# Patient Record
Sex: Male | Born: 1935 | Race: White | Hispanic: No | Marital: Married | Smoking: Current every day smoker
Health system: Southern US, Community
[De-identification: ages and names within clinical notes are randomized; demographics above are authoritative.]

## PROBLEM LIST (undated history)

## (undated) DIAGNOSIS — E785 Hyperlipidemia, unspecified: Secondary | ICD-10-CM

## (undated) DIAGNOSIS — F418 Other specified anxiety disorders: Secondary | ICD-10-CM

## (undated) DIAGNOSIS — R296 Repeated falls: Secondary | ICD-10-CM

## (undated) DIAGNOSIS — K219 Gastro-esophageal reflux disease without esophagitis: Secondary | ICD-10-CM

## (undated) DIAGNOSIS — Z8546 Personal history of malignant neoplasm of prostate: Secondary | ICD-10-CM

## (undated) DIAGNOSIS — F0150 Vascular dementia without behavioral disturbance: Secondary | ICD-10-CM

## (undated) DIAGNOSIS — M6281 Muscle weakness (generalized): Secondary | ICD-10-CM

## (undated) DIAGNOSIS — I129 Hypertensive chronic kidney disease with stage 1 through stage 4 chronic kidney disease, or unspecified chronic kidney disease: Secondary | ICD-10-CM

## (undated) DIAGNOSIS — Z741 Need for assistance with personal care: Secondary | ICD-10-CM

## (undated) DIAGNOSIS — G2581 Restless legs syndrome: Secondary | ICD-10-CM

## (undated) DIAGNOSIS — L89892 Pressure ulcer of other site, stage 2: Secondary | ICD-10-CM

## (undated) DIAGNOSIS — G459 Transient cerebral ischemic attack, unspecified: Secondary | ICD-10-CM

## (undated) DIAGNOSIS — L03116 Cellulitis of left lower limb: Secondary | ICD-10-CM

## (undated) DIAGNOSIS — L89510 Pressure ulcer of right ankle, unstageable: Secondary | ICD-10-CM

## (undated) DIAGNOSIS — G4700 Insomnia, unspecified: Secondary | ICD-10-CM

## (undated) DIAGNOSIS — F32A Depression, unspecified: Secondary | ICD-10-CM

## (undated) DIAGNOSIS — H3530 Unspecified macular degeneration: Secondary | ICD-10-CM

## (undated) DIAGNOSIS — N401 Enlarged prostate with lower urinary tract symptoms: Secondary | ICD-10-CM

## (undated) DIAGNOSIS — R4189 Other symptoms and signs involving cognitive functions and awareness: Secondary | ICD-10-CM

## (undated) DIAGNOSIS — N3941 Urge incontinence: Secondary | ICD-10-CM

## (undated) DIAGNOSIS — R278 Other lack of coordination: Secondary | ICD-10-CM

## (undated) DIAGNOSIS — I10 Essential (primary) hypertension: Secondary | ICD-10-CM

## (undated) DIAGNOSIS — N1832 Chronic kidney disease, stage 3b: Secondary | ICD-10-CM

## (undated) DIAGNOSIS — S43006A Unspecified dislocation of unspecified shoulder joint, initial encounter: Secondary | ICD-10-CM

## (undated) DIAGNOSIS — I69898 Other sequelae of other cerebrovascular disease: Secondary | ICD-10-CM

## (undated) DIAGNOSIS — L89512 Pressure ulcer of right ankle, stage 2: Secondary | ICD-10-CM

## (undated) DIAGNOSIS — N3281 Overactive bladder: Secondary | ICD-10-CM

## (undated) HISTORY — DX: Unspecified macular degeneration: H35.30

## (undated) HISTORY — DX: Transient cerebral ischemic attack, unspecified: G45.9

## (undated) HISTORY — DX: Other lack of coordination: R27.8

## (undated) HISTORY — DX: Restless legs syndrome: G25.81

## (undated) HISTORY — DX: Other specified anxiety disorders: F41.8

## (undated) HISTORY — DX: Cellulitis of left lower limb: L03.116

## (undated) HISTORY — DX: Pressure ulcer of right ankle, unstageable: L89.510

## (undated) HISTORY — DX: Other sequelae of other cerebrovascular disease: I69.898

## (undated) HISTORY — DX: Depression, unspecified: F32.A

## (undated) HISTORY — DX: Hyperlipidemia, unspecified: E78.5

## (undated) HISTORY — PX: APPENDECTOMY: SHX54

## (undated) HISTORY — DX: Chronic kidney disease, stage 3b: N18.32

## (undated) HISTORY — DX: Gastro-esophageal reflux disease without esophagitis: K21.9

## (undated) HISTORY — DX: Overactive bladder: N32.81

## (undated) HISTORY — DX: Pressure ulcer of right ankle, stage 2: L89.512

## (undated) HISTORY — DX: Vascular dementia, unspecified severity, without behavioral disturbance, psychotic disturbance, mood disturbance, and anxiety: F01.50

## (undated) HISTORY — DX: Pressure ulcer of other site, stage 2: L89.892

## (undated) HISTORY — DX: Insomnia, unspecified: G47.00

## (undated) HISTORY — DX: Urge incontinence: N39.41

## (undated) HISTORY — DX: Muscle weakness (generalized): M62.81

## (undated) HISTORY — DX: Other symptoms and signs involving cognitive functions and awareness: R41.89

## (undated) HISTORY — DX: Benign prostatic hyperplasia with lower urinary tract symptoms: N40.1

## (undated) HISTORY — DX: Personal history of malignant neoplasm of prostate: Z85.46

## (undated) HISTORY — PX: HERNIA REPAIR: SHX51

## (undated) HISTORY — DX: Need for assistance with personal care: Z74.1

## (undated) HISTORY — DX: Hypertensive chronic kidney disease with stage 1 through stage 4 chronic kidney disease, or unspecified chronic kidney disease: I12.9

---

## 2007-01-02 ENCOUNTER — Ambulatory Visit: Payer: Self-pay | Admitting: Internal Medicine

## 2007-07-24 ENCOUNTER — Encounter: Payer: Self-pay | Admitting: Internal Medicine

## 2007-08-06 ENCOUNTER — Ambulatory Visit: Payer: Self-pay | Admitting: Gastroenterology

## 2007-08-22 ENCOUNTER — Encounter: Payer: Self-pay | Admitting: Internal Medicine

## 2007-09-21 ENCOUNTER — Encounter: Payer: Self-pay | Admitting: Internal Medicine

## 2007-10-22 ENCOUNTER — Encounter: Payer: Self-pay | Admitting: Internal Medicine

## 2008-05-29 ENCOUNTER — Ambulatory Visit: Payer: Self-pay | Admitting: Cardiology

## 2008-05-29 ENCOUNTER — Ambulatory Visit: Payer: Self-pay | Admitting: Ophthalmology

## 2008-06-10 ENCOUNTER — Ambulatory Visit: Payer: Self-pay | Admitting: Ophthalmology

## 2008-07-08 ENCOUNTER — Ambulatory Visit: Payer: Self-pay | Admitting: Ophthalmology

## 2008-07-15 ENCOUNTER — Ambulatory Visit: Payer: Self-pay | Admitting: Ophthalmology

## 2010-03-20 ENCOUNTER — Emergency Department: Payer: Self-pay | Admitting: Emergency Medicine

## 2010-03-30 ENCOUNTER — Ambulatory Visit: Payer: Self-pay | Admitting: Surgery

## 2010-04-05 ENCOUNTER — Ambulatory Visit: Payer: Self-pay | Admitting: Surgery

## 2010-04-07 LAB — PATHOLOGY REPORT

## 2010-04-15 ENCOUNTER — Emergency Department: Payer: Self-pay | Admitting: Emergency Medicine

## 2010-06-13 ENCOUNTER — Ambulatory Visit: Payer: Self-pay | Admitting: Family Medicine

## 2011-10-25 ENCOUNTER — Inpatient Hospital Stay: Payer: Self-pay | Admitting: Surgery

## 2011-10-25 LAB — URINALYSIS, COMPLETE
Bilirubin,UR: NEGATIVE
Glucose,UR: NEGATIVE mg/dL (ref 0–75)
Hyaline Cast: 1
Ketone: NEGATIVE
Leukocyte Esterase: NEGATIVE
Nitrite: NEGATIVE
Ph: 5 (ref 4.5–8.0)
RBC,UR: 4 /HPF (ref 0–5)
Specific Gravity: 1.042 (ref 1.003–1.030)
Squamous Epithelial: NONE SEEN
WBC UR: <1 /HPF (ref 0–5)

## 2011-10-25 LAB — CBC
MCH: 32.4 pg (ref 26.0–34.0)
Platelet: 187 10*3/uL (ref 150–440)
RBC: 4.27 10*6/uL — ABNORMAL LOW (ref 4.40–5.90)

## 2011-10-25 LAB — COMPREHENSIVE METABOLIC PANEL
Albumin: 3.5 g/dL (ref 3.4–5.0)
Alkaline Phosphatase: 52 U/L (ref 50–136)
Anion Gap: 9 (ref 7–16)
BUN: 24 mg/dL — ABNORMAL HIGH (ref 7–18)
Bilirubin,Total: 1 mg/dL (ref 0.2–1.0)
Calcium, Total: 8.7 mg/dL (ref 8.5–10.1)
Chloride: 97 mmol/L — ABNORMAL LOW (ref 98–107)
Co2: 27 mmol/L (ref 21–32)
Creatinine: 1.27 mg/dL (ref 0.60–1.30)
EGFR (African American): 58 — ABNORMAL LOW
EGFR (Non-African Amer.): 50 — ABNORMAL LOW
Potassium: 3.8 mmol/L (ref 3.5–5.1)
SGOT(AST): 18 U/L (ref 15–37)
SGPT (ALT): 23 U/L
Sodium: 133 mmol/L — ABNORMAL LOW (ref 136–145)
Total Protein: 6.3 g/dL — ABNORMAL LOW (ref 6.4–8.2)

## 2011-10-31 LAB — CULTURE, BLOOD (SINGLE)

## 2012-02-11 IMAGING — CT CT ABD-PELV W/ CM
1 of 2 series · 15 of 32 positions shown, 19 images · IV contrast (isovue)
Comparison: 01/02/2007

REASON FOR EXAM: (1) RLQ  pain; (2) same
COMMENTS:

PROCEDURE:     CT  - CT ABDOMEN / PELVIS  W  - March 20, 2010  [DATE]
RESULT:     History: Right lower quadrant pain.
TECHNIQUE: Multiple axial images of the abdomen and pelvis were performed
from the lung bases to the pubic symphysis, with p.o. contrast and with 100
ml of Isovue 370 intravenous contrast.

[Series 2: soft tissue · axial · 0.75mm/px · z∈[-368,+46]mm · 15 of 152 slices shown, 19 images]
[im 7/152  soft-tissue]
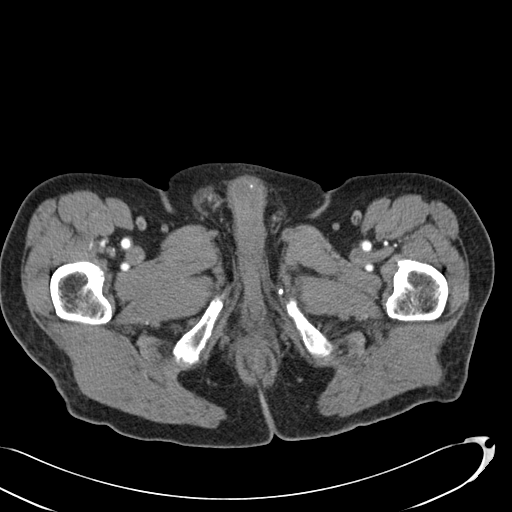
[im 7/152  bone]
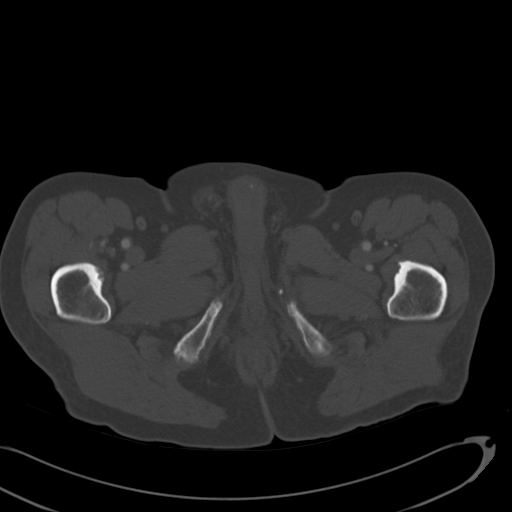
[im 20/152  soft-tissue]
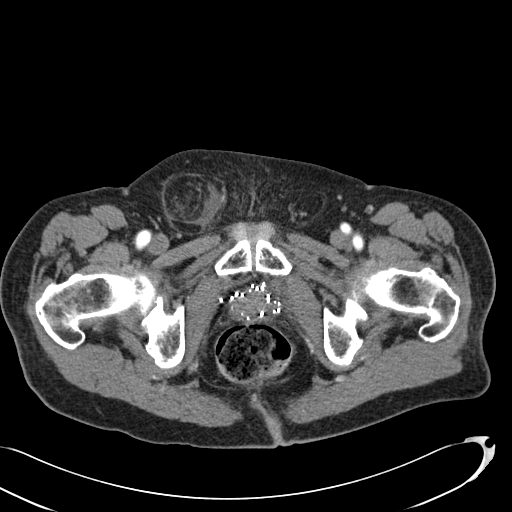
[im 33/152  soft-tissue]
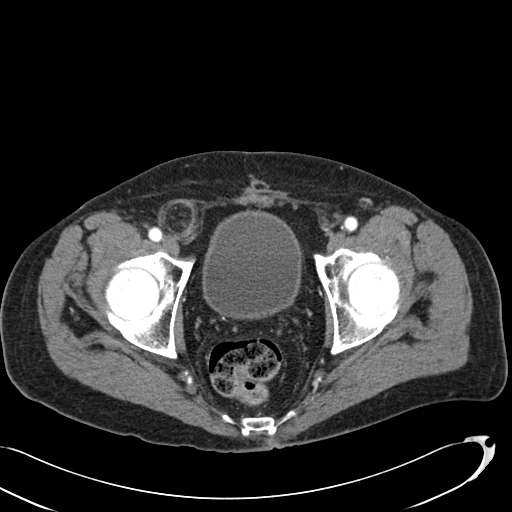
[im 40/152  soft-tissue]
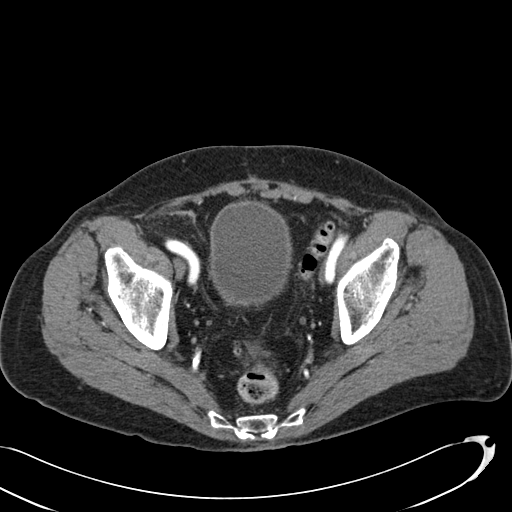
[im 53/152  soft-tissue]
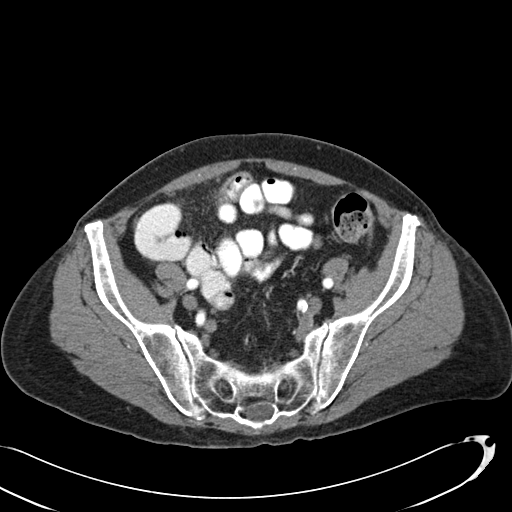
[im 66/152  soft-tissue]
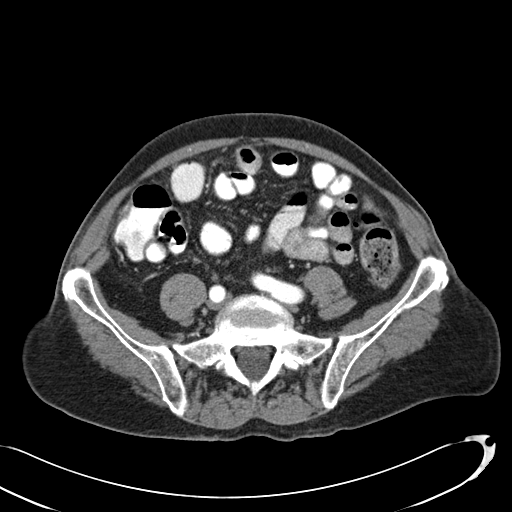
[im 79/152  soft-tissue]
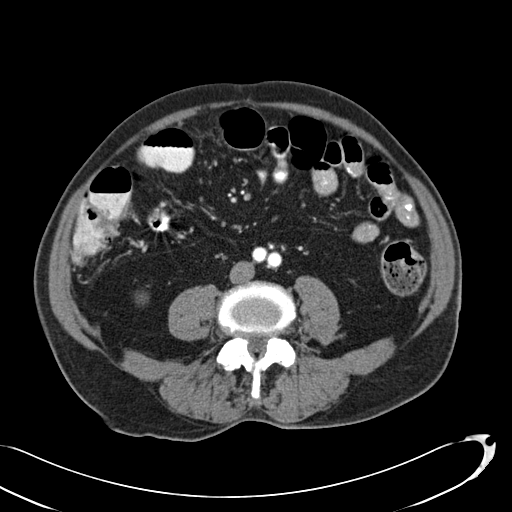
[im 86/152  soft-tissue]
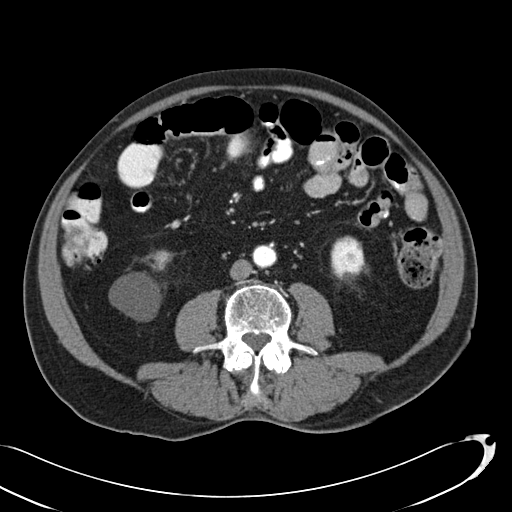
[im 99/152  soft-tissue]
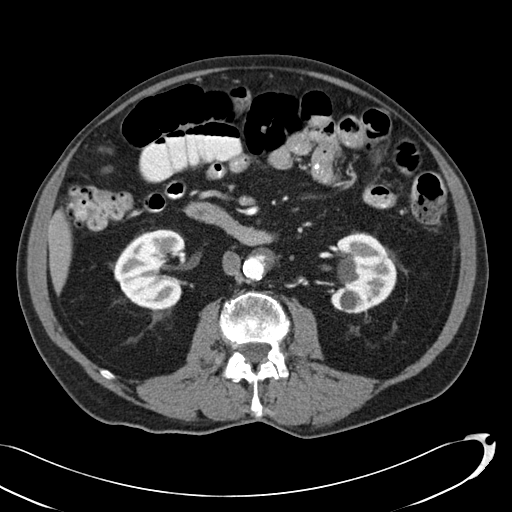
[im 99/152  bone]
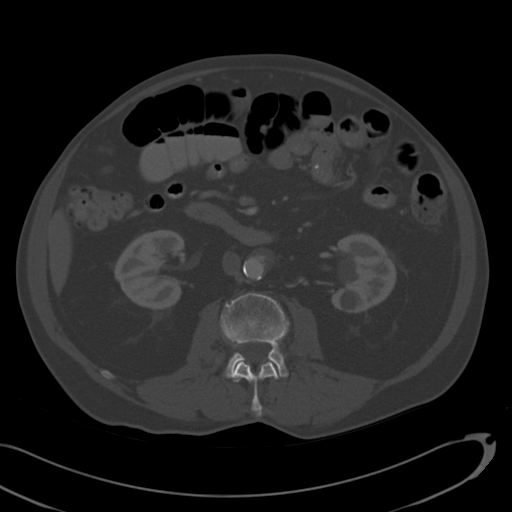
[im 112/152  soft-tissue]
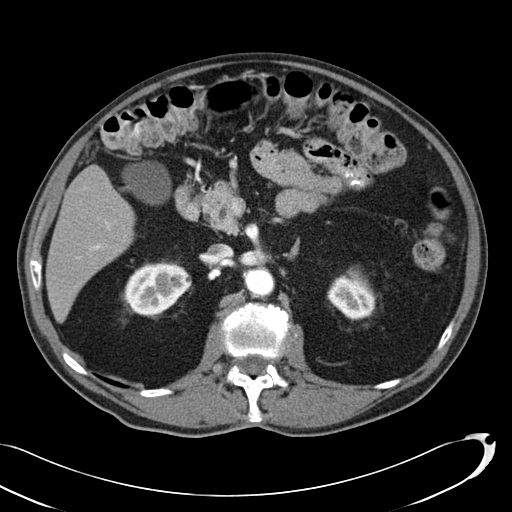
[im 119/152  soft-tissue]
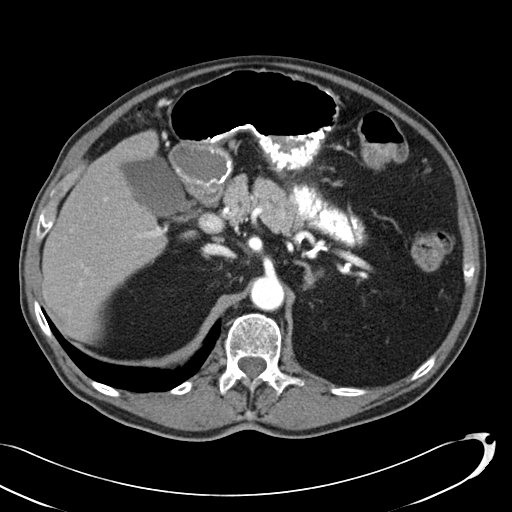
[im 125/152  lung]
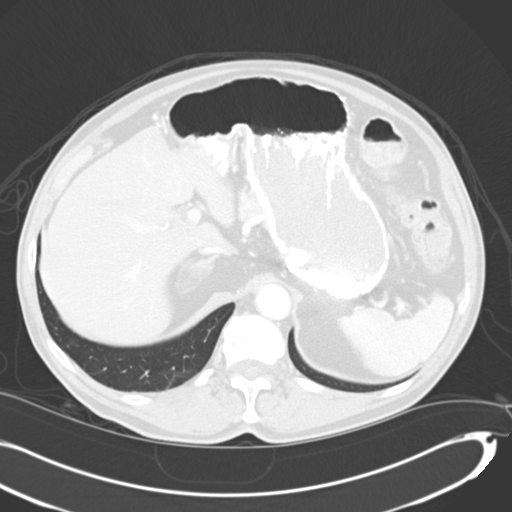
[im 132/152  soft-tissue]
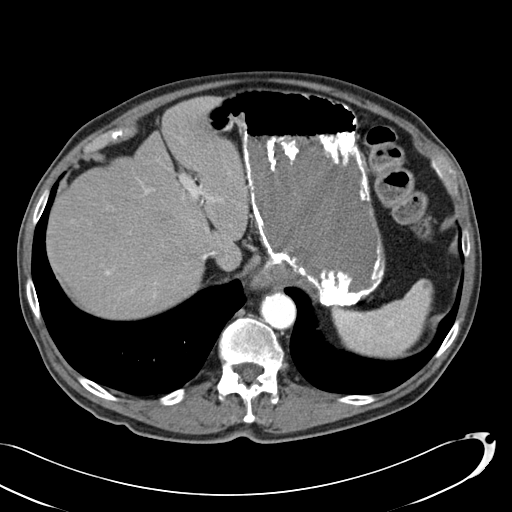
[im 132/152  lung]
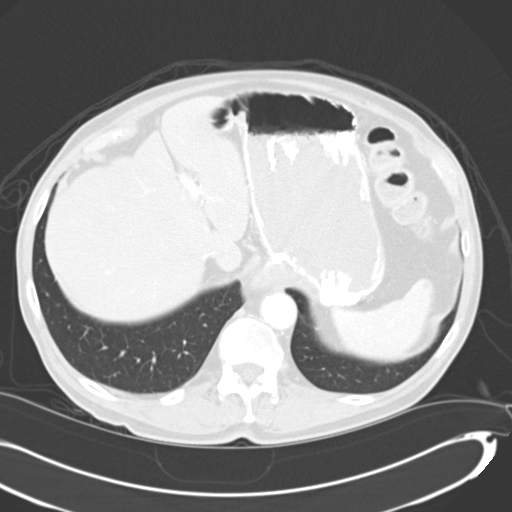
[im 138/152  lung]
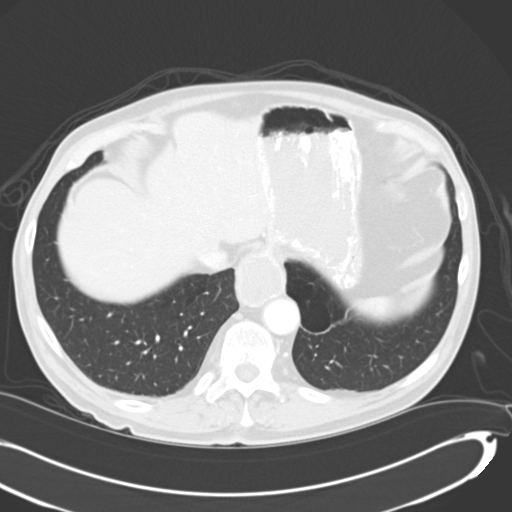
[im 145/152  soft-tissue]
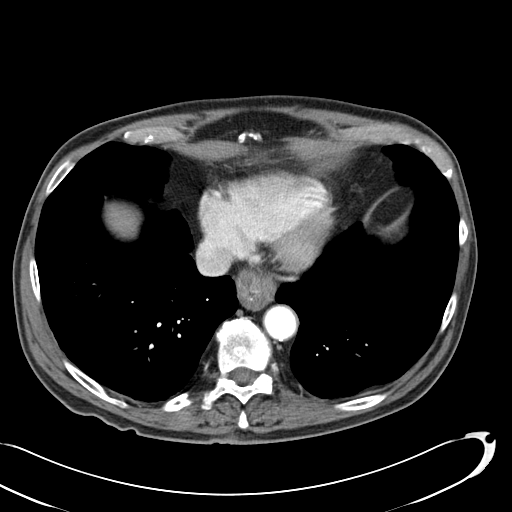
[im 145/152  lung]
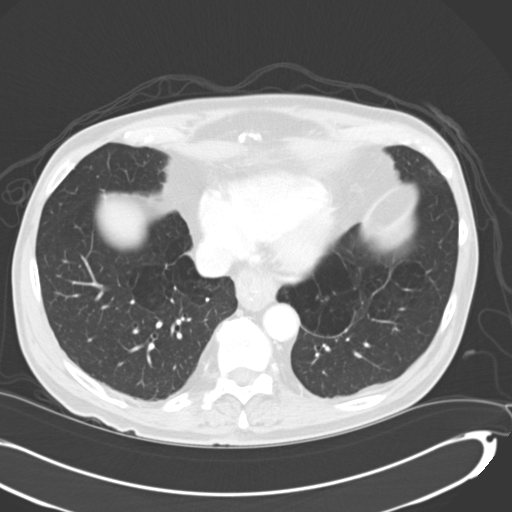

[15 of 32 positions shown; findings below may reference images not displayed]

FINDINGS: There is a calcified left lower lobe pulmonary nodule likely representing a
calcified granuloma. There is no pneumothorax. The heart size is normal.

The liver demonstrates no focal abnormality. There is no intrahepatic or
extrahepatic biliary ductal dilatation. The gallbladder is unremarkable. The
spleen demonstrates no focal abnormality. The adrenal glands, and pancreas
are normal. The bladder is unremarkable. There are bilateral hypodense,
fluid attenuating renal masses most consistent with cysts. Prostatic
radiation seeds are noted.

There is a moderate-sized hiatal hernia. The  duodenum, small intestine, and
large intestine demonstrate no contrast extravasation or dilatation. There
is a normal caliber appendix in the right lower quadrant without
periappendiceal inflammatory changes. There is a fat containing right
inguinal hernia with a small amount of fluid in the right inguinal canal.
There is no pneumoperitoneum, pneumatosis, or portal venous gas. There is no
abdominal or pelvic free fluid. There is no lymphadenopathy.

The abdominal aorta is normal in caliber with atherosclerosis.

There is lumbar spine spondylosis.
IMPRESSION: 1.  There is a fat containing right inguinal hernia with a small amount of
fluid in the right inguinal canal.

2. Normal appendix.

3. There is a moderate sized hiatal hernia.

## 2012-04-16 ENCOUNTER — Ambulatory Visit: Payer: Self-pay | Admitting: Family Medicine

## 2012-06-14 ENCOUNTER — Ambulatory Visit: Payer: Self-pay | Admitting: Gastroenterology

## 2012-06-15 LAB — PATHOLOGY REPORT

## 2012-08-14 ENCOUNTER — Emergency Department: Payer: Self-pay | Admitting: Emergency Medicine

## 2014-04-30 DIAGNOSIS — N183 Chronic kidney disease, stage 3 unspecified: Secondary | ICD-10-CM

## 2014-09-14 NOTE — Op Note (Signed)
PATIENT NAME:  Evan Cole, Evan Cole MR#:  [id number] DATE OF BIRTH:  1919-08-12  DATE OF PROCEDURE:  10/26/2011  PREOPERATIVE DIAGNOSIS: Acute appendicitis with intraabdominal abscess.   POSTOPERATIVE DIAGNOSES:  1. Acute appendicitis (gangrenous, perforated) with intraabdominal abscess.  2. Intraperitoneal adhesions.   PROCEDURES PERFORMED: 1. Laparoscopic enterolysis. 2. Laparoscopic appendectomy.  SURGEON: Consuela Mimes, M.D.   ANESTHESIA: General.   PROCEDURE IN DETAIL: The patient was placed supine on the operating room table and prepped and draped in the usual sterile fashion. An epigastric midline 1 inch incision was made and carried down through the subcutaneous tissue to the linea alba which was opened in the direction of its fibers and a Hassan cannula was introduced into the peritoneum amidst horizontal mattress sutures of 0 Vicryl. A 15 mmHg CO2 pneumoperitoneum was created. There were adhesions from the omentum to the anterior abdominal wall that necessitated lysis and there were also adhesions from the omentum to the abdominal sidewall, in the right lower quadrant, and these necessitated lysis. After this was performed, two additional 5 mm trocars were placed under direct visualization and blunt dissection revealed pus in the right lower quadrant and in the pelvis and this was suctioned clear and then ultimately the omentum was peeled off of a gangrenous appendix. The appendix was lifted and dissected out of the retroperitoneum with the Harmonic scalpel and this effectively divided the mesoappendix as well. The appendectomy was performed at the base of the appendix where it joined the cecum with an Endo GIA stapling device and the appendix was placed in an Endo Catch bag and extracted from the abdomen via the epigastric port. Following this the right lower quadrant was irrigated with copious amounts of warm normal saline and this was all suctioned clear including the entire right  gutter and the entire pelvis as well as the retroperitoneum in the interloop areas where there was still some fibrinous exudate. The peritoneum was then desufflated and decannulated and the linea alba was closed with a running 0 PDS suture and the previously placed Vicryls were tied one to another. All three skin sites were closed with subcuticular 5-0 Monocryl and suture strips. The patient tolerated the procedure well. There were no complications. ____________________________ Consuela Mimes, MD wfm:slb D: 10/26/2011 01:23:00 ET T: 10/26/2011 09:28:09 ET JOB#: [id number]  cc: Consuela Mimes, MD, <Dictator>  Consuela Mimes MD ELECTRONICALLY SIGNED 10/26/2011 21:26

## 2014-09-14 NOTE — Discharge Summary (Signed)
PATIENT NAME:  Evan Cole, Evan Cole MR#:  [id number] DATE OF BIRTH:  08-Oct-1919  DATE OF ADMISSION:  10/25/2011 DATE OF DISCHARGE:  10/29/2011  BRIEF HISTORY: Mr. Chapman Matteucci is an 81 year old gentleman seen in the Emergency Room with signs and symptoms consistent with acute appendicitis. He was seen by Dr. Leanora Cover at the time. He had what appeared to be an intraabdominal abscess and CT changes consistent with possible perforated diverticulitis or appendicitis. He was taken urgently to surgery where he underwent laparoscopic enterolysis and laparoscopic appendectomy. Acute appendicitis, gangrenous, with perforation of the intraabdominal abscess was identified. He was placed on IV antibiotics. He was transferred to the floor and improved over the next 48 hours. This morning he is afebrile. His wounds look good. There is no sign of any infection. He is tolerating a full liquid diet. Will switch him to p.o. antibiotics and see him back in the office in 7 to 10 days' time. Bathing, activity, and driving instructions were given to the patient.   DISCHARGE MEDICATIONS:  1. Vicodin for pain. 2. Cleocin 300 mg p.o. q.8 hours. 3. Keflex 500 mg p.o. q.8 hours.  In addition to his home medications: 1. Lescol 40 mg p.o. daily.  2. Aggrenox 25 mg/200 mg p.o. b.i.d.  3. Citalopram 10 mg p.o. once a day.  4. Metoprolol ER 50 mg once a day. 5. Hydrochlorothiazide 25 mg once a day. 6. Calcium Plus Vitamin D once a day. 7. FiberCon 625 mg p.o. daily.   FINAL DISCHARGE DIAGNOSIS: Acute ruptured gangrenous appendicitis with intraabdominal abscess.       SURGERY: Laparoscopic appendectomy and enterolysis.   ____________________________ Micheline Maze, MD rle:drc D: 10/29/2011 14:00:58 ET T: 10/31/2011 11:43:56 ET JOB#: [id number]  cc: Micheline Maze, MD, <Dictator> Ocie Cornfield. Ouida Sills, MD Rodena Goldmann MD ELECTRONICALLY SIGNED 11/01/2011 8:38

## 2014-09-14 NOTE — H&P (Signed)
History of Present Illness 2 1/2 days ago developed mild lower abdominal discomfort. Has been sleepier and had a fever at home > 102.0 F. Associated with decreased appetite and when he got up from a nap today he experienced much worse pain that had localized to the RLQ.  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:   toxoplasmosis:   prostate CA:   left eye surgery:   ALLERGIES:  Codeine: N/V  Doxycycline: Hives  Tetanus Toxoid: Hives  Horse Serum: Unknown  HOME MEDICATIONS: Medication Instructions Status  Lescol 40 mg capsule 1 cap(s) orally once a day (in the morning)  Active  Aggrenox 25 mg-200 mg capsule, extended release 1 cap(s) orally 2 times a day  Active  citalopram 10 mg tablet 1 tab(s) orally once a day  Active  Metoprolol Succinate ER 50 mg tablet, extended release 2 tab(s) orally once a day (in the morning)  Active  hydrochlorothiazide 25 mg tablet 1 tab(s) orally once a day (in the morning)  Active   Family and Social History:   Family History Non-Contributory    Social History positive tobacco (Greater than 1 year), positive ETOH, negative Illicit drugs, retired Engineering geologist, married, smokes a pipe and has one stiff ETOH beverage with dinner each night    Place of Living Home   Review of Systems:   Fever/Chills Yes    Cough No    Sputum No    Abdominal Pain Yes    Diarrhea No    Constipation No    Nausea/Vomiting No    SOB/DOE No    Chest Pain No    Dysuria No    Tolerating PT Yes    Tolerating Diet No  last meal @ noon    Medications/Allergies Reviewed Medications/Allergies reviewed   Physical Exam:   GEN well developed, well nourished    HEENT pink conjunctivae, PERRL, hearing intact to voice, Oropharynx clear    NECK supple  trachea midline    RESP normal  resp effort  clear BS  no use of accessory muscles    CARD regular rate  no murmur  no Rub    ABD positive tenderness  RLQ rebound    LYMPH negative neck    EXTR negative cyanosis/clubbing, negative edema    SKIN normal to palpation, skin turgor good    NEURO cranial nerves intact, follows commands, strength:, motor/sensory function intact    PSYCH alert, A+O to time, place, person, good insight   Lab Results: Hepatic:  04-Jun-13 18:27    Bilirubin, Total 1.0   Alkaline Phosphatase 52   SGPT (ALT) 23 (12-78 NOTE: NEW REFERENCE RANGE 04/15/2011)   SGOT (AST) 18   Total Protein, Serum  6.3   Albumin, Serum 3.5  Routine BB:  04-Jun-13 18:27    ABO Group + Rh Type A Positive   Antibody Screen NEGATIVE (Result(s) reported on 25 Oct 2011 at 09:23PM.)  Routine Chem:  04-Jun-13 18:27    Glucose, Serum  120   BUN  24   Creatinine (comp) 1.27   Sodium, Serum  133   Potassium, Serum 3.8   Chloride, Serum  97   CO2, Serum 27   Calcium (Total), Serum 8.7   Osmolality (calc) 272   eGFR (African American)  58   eGFR (Non-African American)  50 (eGFR values <68m/min/1.73 m2 may be an indication of chronic kidney disease (CKD). Calculated eGFR is useful in patients with stable renal function. The eGFR calculation will not be reliable in acutely ill patients when serum creatinine is changing rapidly. It is not useful in  patients on dialysis. The eGFR calculation may not be applicable to patients at the low and high extremes of body sizes, pregnant women, and vegetarians.)   Anion Gap 9  Routine UA:  04-Jun-13 18:27    Color (UA) Yellow   Clarity (UA) Clear   Glucose (UA) Negative   Bilirubin (UA) Negative   Ketones (UA) Negative   Specific Gravity (UA) 1.042   Blood (UA) Negative   pH (UA) 5.0   Protein (UA) Negative   Nitrite (UA) Negative   Leukocyte Esterase (UA) Negative (Result(s) reported on 25 Oct 2011 at 09:51PM.)   RBC (UA) 4 /HPF   WBC (UA) <1 /HPF    Bacteria (UA) NONE SEEN   Epithelial Cells (UA) NONE SEEN   Mucous (UA) PRESENT   Hyaline Cast (UA) 1 /LPF (Result(s) reported on 25 Oct 2011 at 09:51PM.)  Routine Coag:  04-Jun-13 18:27    Prothrombin 14.1   INR 1.1 (INR reference interval applies to patients on anticoagulant therapy. A single INR therapeutic range for coumarins is not optimal for all indications; however, the suggested range for most indications is 2.0 - 3.0. Exceptions to the INR Reference Range may include: Prosthetic heart valves, acute myocardial infarction, prevention of myocardial infarction, and combinations of aspirin and anticoagulant. The need for a higher or lower target INR must be assessed individually. Reference: The Pharmacology and Management of the Vitamin K  antagonists: the seventh ACCP Conference on Antithrombotic and Thrombolytic Therapy. CUYQIH.4742Sept:126 (3suppl): [id number] A HCT value >55% may artifactually increase the PT.  In one study,  the increase was an average of 25%. Reference:  "Effect on Routine and Special Coagulation Testing Values of Citrate Anticoagulant Adjustment in Patients with High HCT Values." American Journal of Clinical Pathology 2006;126:400-405.)   Activated PTT (APTT) 32.6 (A HCT value >55% may artifactually increase the APTT. In one study, the increase was an average of 19%. Reference: "Effect on Routine and Special Coagulation Testing Values of Citrate Anticoagulant Adjustment in Patients with High HCT Values." American Journal of Clinical Pathology 2006;126:400-405.)  Routine Hem:  04-Jun-13 18:27    WBC (CBC)  12.1   RBC (CBC)  4.27   Hemoglobin (CBC) 13.8   Hematocrit (CBC) 41.6   Platelet Count (CBC) 187 (Result(s) reported on 25 Oct 2011 at 07:53PM.)   MCV 98   MCH 32.4   MCHC 33.2   RDW 12.6   Radiology Results: CT:    04-Jun-13 20:47, CT Abdomen and Pelvis With Contrast   CT Abdomen and Pelvis With Contrast   REASON FOR EXAM:    (1) RLQ  paibn; (2) RKQ pain  COMMENTS:       PROCEDURE: CT  - CT ABDOMEN / PELVIS  W  - Oct 25 2011  8:47PM     RESULT: History: Right lower quadrant pain.    Comparison Study: Prior CT of 03/20/2010.    Findings: Standard CT obtained with study 75 cc of Isovue-300. Coronary   artery disease is present. Liver normal. Gallbladder nondistended. Spleen   normal. Pancreas normal. Adrenals normal. Multiple renal cysts are   present bilaterally. A approximately 2.7 cm aortic aneurysm is present.  This is stable from 2011. Mesenteric thickening is noted in the right   lower quadrant medial to the cecum. Cecal diverticulosis is noted. These     findings could represent cecal diverticulitis. Small amount of air is   noted within the mesentery.A contained perforation may be present. These   findings could also be related to appendicitis, No free air noted under   the hemidiaphragms. There are dilated distal loops of small bowel   suggesting developing small bowel obstruction versus focal small bowel   ileus. Prostate implants are present. Hydrocele on the right noted.    IMPRESSION:  Mesenteric soft tissue thickening with contained   extraluminal air in the right lower quadrant. These findings suggest   phlegmon from possibly cecal diverticulitis, bowel malignancy, or   appendicitis. Associated partial small bowel obstruction versus a focal   ileus involving distal small bowel loops.          Verified By: Osa Craver, M.D., MD     Assessment/Admission Diagnosis Acute appendicitis or cecal diverticulitis with focal perforation and abscess.    Plan Laparoscopic Appendectomy, possible Open (ileo)Colectomy   Electronic Signatures: Consuela Mimes (MD)  (Signed 04-Jun-13 23:33)  Authored: CHIEF COMPLAINT and HISTORY, PAST MEDICAL/SURGIAL HISTORY, ALLERGIES, HOME MEDICATIONS, FAMILY AND SOCIAL HISTORY, REVIEW OF SYSTEMS, PHYSICAL EXAM, LABS, Radiology, ASSESSMENT AND PLAN   Last  Updated: 04-Jun-13 23:33 by Consuela Mimes (MD)

## 2014-09-14 NOTE — Op Note (Signed)
PATIENT NAME:  Evan Cole, Evan Cole MR#:  [id number] DATE OF BIRTH:  [birth year]  DATE OF PROCEDURE:  10/25/2011  PREOPERATIVE DIAGNOSIS: Acute appendicitis with intraabdominal abscess.   POSTOPERATIVE DIAGNOSES:  1. Acute appendicitis (gangrenous, perforated) with intraabdominal abscess.  2. Intraperitoneal adhesions.   PROCEDURES PERFORMED: 1. Laparoscopic enterolysis. 2. Laparoscopic appendectomy.  SURGEON: Consuela Mimes, M.D.   ANESTHESIA: General.   PROCEDURE IN DETAIL: The patient was placed supine on the operating room table and prepped and draped in the usual sterile fashion. An epigastric midline 1 inch incision was made and carried down through the subcutaneous tissue to the linea alba which was opened in the direction of its fibers and a Hassan cannula was introduced into the peritoneum amidst horizontal mattress sutures of 0 Vicryl. A 15 mmHg CO2 pneumoperitoneum was created. There were adhesions from the omentum to the anterior abdominal wall that necessitated lysis and there were also adhesions from the omentum to the abdominal sidewall, in the right lower quadrant, and these necessitated lysis. After this was performed, two additional 5 mm trocars were placed under direct visualization and blunt dissection revealed pus in the right lower quadrant and in the pelvis and this was suctioned clear and then ultimately the omentum was peeled off of a gangrenous appendix. The appendix was lifted and dissected out of the retroperitoneum with the Harmonic scalpel and this effectively divided the mesoappendix as well. The appendectomy was performed at the base of the appendix where it joined the cecum with an Endo GIA stapling device and the appendix was placed in an Endo Catch bag and extracted from the abdomen via the epigastric port. Following this the right lower quadrant was irrigated with copious amounts of warm normal saline and this was all suctioned clear including the entire right  gutter and the entire pelvis as well as the retroperitoneum in the interloop areas where there was still some fibrinous exudate. The peritoneum was then desufflated and decannulated and the linea alba was closed with a running 0 PDS suture and the previously placed Vicryls were tied one to another. All three skin sites were closed with subcuticular 5-0 Monocryl and suture strips. The patient tolerated the procedure well. There were no complications. ____________________________ Consuela Mimes, MD wfm:slb D: 10/26/2011 01:23:12 ET T: 10/26/2011 09:28:09 ET JOB#: [id number]  cc: Consuela Mimes, MD, <Dictator>

## 2015-02-27 ENCOUNTER — Other Ambulatory Visit: Payer: Self-pay | Admitting: Physician Assistant

## 2015-02-27 ENCOUNTER — Ambulatory Visit
Admission: RE | Admit: 2015-02-27 | Discharge: 2015-02-27 | Disposition: A | Discharge status: Home / Self Care | Payer: Medicare Other | Source: Ambulatory Visit | Attending: Physician Assistant | Admitting: Physician Assistant

## 2015-02-27 DIAGNOSIS — S0990XA Unspecified injury of head, initial encounter: Secondary | ICD-10-CM

## 2015-02-27 DIAGNOSIS — Z9229 Personal history of other drug therapy: Secondary | ICD-10-CM

## 2015-02-27 DIAGNOSIS — Z7901 Long term (current) use of anticoagulants: Secondary | ICD-10-CM | POA: Insufficient documentation

## 2015-02-27 DIAGNOSIS — W19XXXA Unspecified fall, initial encounter: Secondary | ICD-10-CM | POA: Diagnosis not present

## 2015-02-27 DIAGNOSIS — I672 Cerebral atherosclerosis: Secondary | ICD-10-CM | POA: Diagnosis not present

## 2015-04-07 DIAGNOSIS — S52123A Displaced fracture of head of unspecified radius, initial encounter for closed fracture: Secondary | ICD-10-CM | POA: Insufficient documentation

## 2015-04-22 ENCOUNTER — Emergency Department: Payer: Medicare Other

## 2015-04-22 ENCOUNTER — Emergency Department
Admission: EM | Admit: 2015-04-22 | Discharge: 2015-04-22 | Disposition: A | Discharge status: Home / Self Care | Payer: Medicare Other | Attending: Emergency Medicine | Admitting: Emergency Medicine

## 2015-04-22 ENCOUNTER — Encounter: Payer: Self-pay | Admitting: Emergency Medicine

## 2015-04-22 DIAGNOSIS — Y92009 Unspecified place in unspecified non-institutional (private) residence as the place of occurrence of the external cause: Secondary | ICD-10-CM | POA: Diagnosis not present

## 2015-04-22 DIAGNOSIS — S43005A Unspecified dislocation of left shoulder joint, initial encounter: Secondary | ICD-10-CM | POA: Insufficient documentation

## 2015-04-22 DIAGNOSIS — I10 Essential (primary) hypertension: Secondary | ICD-10-CM | POA: Diagnosis not present

## 2015-04-22 DIAGNOSIS — Y998 Other external cause status: Secondary | ICD-10-CM | POA: Insufficient documentation

## 2015-04-22 DIAGNOSIS — Y9301 Activity, walking, marching and hiking: Secondary | ICD-10-CM | POA: Insufficient documentation

## 2015-04-22 DIAGNOSIS — F17200 Nicotine dependence, unspecified, uncomplicated: Secondary | ICD-10-CM | POA: Insufficient documentation

## 2015-04-22 DIAGNOSIS — W01198A Fall on same level from slipping, tripping and stumbling with subsequent striking against other object, initial encounter: Secondary | ICD-10-CM | POA: Insufficient documentation

## 2015-04-22 DIAGNOSIS — S4992XA Unspecified injury of left shoulder and upper arm, initial encounter: Secondary | ICD-10-CM | POA: Diagnosis present

## 2015-04-22 HISTORY — DX: Unspecified dislocation of unspecified shoulder joint, initial encounter: S43.006A

## 2015-04-22 HISTORY — DX: Essential (primary) hypertension: I10

## 2015-04-22 HISTORY — DX: Transient cerebral ischemic attack, unspecified: G45.9

## 2015-04-22 HISTORY — DX: Repeated falls: R29.6

## 2015-04-22 MED ORDER — ONDANSETRON HCL 4 MG/2ML IJ SOLN
4.0000 mg | Freq: Once | INTRAMUSCULAR | Status: AC
  Administered 2015-04-22: 4 mg via INTRAVENOUS
  Filled 2015-04-22: qty 2

## 2015-04-22 MED ORDER — LORAZEPAM 2 MG/ML IJ SOLN
1.0000 mg | Freq: Once | INTRAMUSCULAR | Status: AC
  Administered 2015-04-22: 1 mg via INTRAVENOUS
  Filled 2015-04-22: qty 1

## 2015-04-22 MED ORDER — PROPOFOL 10 MG/ML IV BOLUS: 1.0000 mg/kg | Freq: Once | INTRAVENOUS | Status: DC

## 2015-04-22 MED ORDER — PROPOFOL 10 MG/ML IV BOLUS
INTRAVENOUS | Status: AC | PRN
  Administered 2015-04-22: 50 mg via INTRAVENOUS

## 2015-04-22 MED ORDER — PROPOFOL 1000 MG/100ML IV EMUL
INTRAVENOUS | Status: AC
  Filled 2015-04-22: qty 100

## 2015-04-22 MED ORDER — TRAMADOL HCL 50 MG PO TABS: 50.0000 mg | ORAL_TABLET | Freq: Four times a day (QID) | ORAL | Status: DC | PRN

## 2015-04-22 MED ORDER — HYDROMORPHONE HCL 1 MG/ML IJ SOLN
1.0000 mg | Freq: Once | INTRAMUSCULAR | Status: AC
  Administered 2015-04-22: 1 mg via INTRAVENOUS
  Filled 2015-04-22: qty 1

## 2015-04-22 NOTE — ED Notes (Signed)
Patient transported to X-ray 

## 2015-04-22 NOTE — ED Notes (Signed)
Given water. Ok per dr Marcelene Butte

## 2015-04-22 NOTE — ED Notes (Signed)
Discharge instructions signed by patient. Wife also present when reviewed with pt.

## 2015-04-22 NOTE — ED Notes (Signed)
Propofol 50 mg IV push given by dr Marcelene Butte once.  This was only amount given.

## 2015-04-22 NOTE — ED Notes (Signed)
Pt reports they have family or friends that can help his wife and him get into the house safely.

## 2015-04-22 NOTE — ED Notes (Signed)
Pt was using walker and feet got caught and pt fell. Left shoulder deformity.

## 2015-04-22 NOTE — Discharge Instructions (Signed)
Shoulder Dislocation °A shoulder dislocation happens when the upper arm bone (humerus) moves out of the shoulder joint. The shoulder joint is the part of the shoulder where the humerus, shoulder blade (scapula), and collarbone (clavicle) meet. °CAUSES °This condition is often caused by: °· A fall. °· A hit to the shoulder. °· A forceful movement of the shoulder. °RISK FACTORS °This condition is more likely to develop in people who play sports. °SYMPTOMS °Symptoms of this condition include: °· Deformity of the shoulder. °· Intense pain. °· Inability to move the shoulder. °· Numbness, weakness, or tingling in your neck or down your arm. °· Bruising or swelling around your shoulder. °DIAGNOSIS °This condition is diagnosed with a physical exam. After the exam, tests may be done to check for related problems. Tests that may be done include: °· X-ray. This may be done to check for broken bones. °· MRI. This may be done to check for damage to the tissues around the shoulder. °· Electromyogram. This may be done to check for nerve damage. °TREATMENT °This condition is treated with a procedure to place the humerus back in the joint. This procedure is called a reduction. There are two types of reduction: °· Closed reduction. In this procedure, the humerus is placed back in the joint without surgery. The health care provider uses his or her hands to guide the bone back into place. °· Open reduction. In this procedure, the humerus is placed back in the joint with surgery. An open reduction may be recommended if: °¨ You have a weak shoulder joint or weak ligaments. °¨ You have had more than one shoulder dislocation. °¨ The nerves or blood vessels around your shoulder have been damaged. °After the humerus is placed back into the joint, your arm will be placed in a splint or sling to prevent it from moving. You will need to wear the splint or sling until your shoulder heals. When the splint or sling is removed, you may have  physical therapy to help improve the range of motion in your shoulder joint. °HOME CARE INSTRUCTIONS °If You Have a Splint or Sling: °· Wear it as told by your health care provider. Remove it only as told by your health care provider. °· Loosen it if your fingers become numb and tingle, or if they turn cold and blue. °· Keep it clean and dry. °Bathing °· Do not take baths, swim, or use a hot tub until your health care provider approves. Ask your health care provider if you can take showers. You may only be allowed to take sponge baths for bathing. °· If your health care provider approves bathing and showering, cover your splint or sling with a watertight plastic bag to protect it from water. Do not let the splint or sling get wet. °Managing Pain, Stiffness, and Swelling °· If directed, apply ice to the injured area. °¨ Put ice in a plastic bag. °¨ Place a towel between your skin and the bag. °¨ Leave the ice on for 20 minutes, 2-3 times per day. °· Move your fingers often to avoid stiffness and to decrease swelling. °· Raise (elevate) the injured area above the level of your heart while you are sitting or lying down. °Driving °· Do not drive while wearing a splint or sling on a hand that you use for driving. °· Do not drive or operate heavy machinery while taking pain medicine. °Activity °· Return to your normal activities as told by your health care provider. Ask your   health care provider what activities are safe for you.  Perform range-of-motion exercises only as told by your health care provider.  Exercise your hand by squeezing a soft ball. This helps to decrease stiffness and swelling in your hand and wrist. General Instructions  Take over-the-counter and prescription medicines only as told by your health care provider.  Do not use any tobacco products, including cigarettes, chewing tobacco, or e-cigarettes. Tobacco can delay bone and tissue healing. If you need help quitting, ask your health care  provider.  Keep all follow-up visits as told by your health care provider. This is important. SEEK MEDICAL CARE IF:  Your splint or sling gets damaged. SEEK IMMEDIATE MEDICAL CARE IF:  Your pain gets worse rather than better.  You lose feeling in your arm or hand.  Your arm or hand becomes white and cold.   This information is not intended to replace advice given to you by your health care provider. Make sure you discuss any questions you have with your health care provider.   Document Released: 02/01/2001 Document Revised: 01/28/2015 Document Reviewed: 09/01/2014 Elsevier Interactive Patient Education [employer].  Please return immediately if condition worsens. Please contact her primary physician or the physician you were given for referral. If you have any specialist physicians involved in her treatment and plan please also contact them. Thank you for using [facility].

## 2015-04-22 NOTE — ED Provider Notes (Signed)
Time Seen: Approximately 1150  I have reviewed the triage notes  Chief Complaint: Fall   History of Present Illness: Evan Cole. is a 81 y.o. male who had a non-syncopal fall today. Patient was using his walker at home and states his feet Caught up in the walker and the patient fell landing primarily on his left hip and left upper extremity. He states he does feel like he hit his head but denies any loss of consciousness. He denies any neck thoracic or lumbar spine pain he denies any hip discomfort. Patient is exclusively complains of left shoulder pain. Patient states he cannot move his left shoulder below shoulder height without any significant pain and appears locked so that he holds his left upper extremity with his hand behind his head. He denies any weakness in his left upper extremity. He denies any wrist or elbow pain.   Past Medical History  Diagnosis Date  . Hypertension   . TIA (transient ischemic attack)   . Frequent falls   . Shoulder dislocation     There are no active problems to display for this patient.   Past Surgical History  Procedure Laterality Date  . Appendectomy    . Hernia repair      Past Surgical History  Procedure Laterality Date  . Appendectomy    . Hernia repair      Current Outpatient Rx  Name  Route  Sig  Dispense  Refill  . traMADol (ULTRAM) 50 MG tablet   Oral   Take 1 tablet (50 mg total) by mouth every 6 (six) hours as needed.   20 tablet   0     Allergies:  Codeine and Doxycycline  Family History: History reviewed. No pertinent family history.  Social History: Social History  Substance Use Topics  . Smoking status: Current Every Day Smoker  . Smokeless tobacco: None  . Alcohol Use: Yes     Review of Systems:   10 point review of systems was performed and was otherwise negative:  Constitutional: No fever Eyes: No visual disturbances ENT: No sore throat, ear pain Cardiac: No chest pain Respiratory: No  shortness of breath, wheezing, or stridor Abdomen: No abdominal pain, no vomiting, No diarrhea Endocrine: No weight loss, No night sweats Extremities: No peripheral edema, cyanosis Skin: Patient has a small skin tear on the right upper extremity Neurologic: No focal weakness, trouble with speech or swollowing Urologic: No dysuria, Hematuria, or urinary frequency   Physical Exam:  ED Triage Vitals  Enc Vitals Group     BP 04/22/15 1137 162/93 mmHg     Pulse Rate 04/22/15 1137 76     Resp 04/22/15 1137 20     Temp 04/22/15 1137 97.8 F (36.6 C)     Temp Source 04/22/15 1137 Oral     SpO2 04/22/15 1137 98 %     Weight 04/22/15 1137 160 lb (72.576 kg)     Height 04/22/15 1137 5\' 10"  (1.778 m)     Head Cir --      Peak Flow --      Pain Score 04/22/15 1138 7     Pain Loc --      Pain Edu? --      Excl. in [place]? --     General: Awake , Alert , and Oriented times 3; GCS 15 Head: Normal cephalic , atraumatic Eyes: Pupils equal , round, reactive to light Nose/Throat: No nasal drainage, patent upper airway without erythema  or exudate.  Neck: Supple, Full range of motion, No anterior adenopathy or palpable thyroid masses Lungs: Clear to ascultation without wheezes , rhonchi, or rales Heart: Regular rate, regular rhythm without murmurs , gallops , or rubs Abdomen: Soft, non tender without rebound, guarding , or rigidity; bowel sounds positive and symmetric in all 4 quadrants. No organomegaly .        Extremities: Patient has his left upper extremity in a extended fashion. The extremity is neurovascularly intact  . Obvious deformity at the left shoulder Neurologic: normal ambulation, Motor symmetric without deficits, sensory intact Skin: warm, dry, no rashes. Small skin tear on the right forearm  Radiologic studies EXAM: LEFT SHOULDER - 2+ VIEW  COMPARISON: Prior today  FINDINGS: There has been successful reduction of the previously seen humeral head dislocation. Hill-Sachs  fracture deformity again noted, however avulsion fracture fragments from the humeral head seen on prior study today are not as well visualized on the current exam.  IMPRESSION: Successful reduction of previously seen shoulder dislocation.   Electronically Signed By: Earle Gell M.D. On: 04/22/2015 14:24          DG Shoulder Left (Final result) Result time: 04/22/15 12:38:23   Final result by Rad Results In Interface (04/22/15 12:38:23)   Narrative:   CLINICAL DATA: Fall today from standing, proximal humeral pain. Limited range of motion.  EXAM: LEFT SHOULDER - 2+ VIEW  COMPARISON: None.  FINDINGS: Left humeral head is dislocated anteriorly-inferiorly relative to the glenohumeral joint space. There is an associated Hill-Sachs fracture deformity within the superior aspect of the left humeral head with associated avulsion fracture fragments.  No convincing fracture seen within the left scapula. Overlying acromioclavicular joint space is well aligned. Visualized portions of the left upper ribs appear intact.  IMPRESSION: 1. Anterior-inferior dislocation of the left humeral head relative to the glenoid fossa. 2. Displaced Hill-Sachs fracture deformity within the superior aspect of the left humeral head, with associated avulsion fracture fragments.        Procedures:  #1 conscious sedation: After alternatives discussed and risk factors reviewed the patient consented for conscious sedation for reduction of his shoulder dislocation. Patient's airway was checked and appropriate computer entry was performed. Patient received propofol less then 1 mg/kg bolus with adequate sedation for reduction. Patient had no episodes of hypoxia and tolerated the procedure well. Conscious sedation time total 33 minutes of one-on-one bedside management and the drug was placed by myself. Patient was discharged when able to drink and ambulate at baseline  #2 The location of left  shoulder anterior and inferior dislocation. After alternatives discussed and risk factors reviewed the patient had reduction of his left shoulder dislocation after conscious sedation was established. Patient's shoulder was reduced with first distracting the left upper extremities. He and then reducing it as a standard anterior dislocation with traction countertraction. Patient tolerated procedure well   Patient's stay here was uneventful and after the procedure was performed the patient was observed here in emergency department. He was given a left arm sling. He was referred to orthopedics unassigned. He was advised take over-the-counter pain medication possible. He was written for Ultram to take as needed. Patient was discharged home with his wife was present when discussed discharge instructions  Assessment: Left shoulder inferior/anterior dislocation  Final Clinical Impression:  Final diagnoses:  Shoulder dislocation, left, initial encounter     Plan: * Outpatient management Patient was advised to return immediately if condition worsens. Patient was advised to follow up with her  primary care physician or other specialized physicians involved and in their current assessment.            Daymon Larsen, MD 04/22/15 [phone]

## 2015-04-24 DIAGNOSIS — S43006A Unspecified dislocation of unspecified shoulder joint, initial encounter: Secondary | ICD-10-CM | POA: Insufficient documentation

## 2015-04-24 DIAGNOSIS — S42253A Displaced fracture of greater tuberosity of unspecified humerus, initial encounter for closed fracture: Secondary | ICD-10-CM | POA: Insufficient documentation

## 2015-09-29 DIAGNOSIS — G2581 Restless legs syndrome: Secondary | ICD-10-CM | POA: Insufficient documentation

## 2015-12-17 DIAGNOSIS — J449 Chronic obstructive pulmonary disease, unspecified: Secondary | ICD-10-CM | POA: Insufficient documentation

## 2016-09-30 DIAGNOSIS — Z7185 Encounter for immunization safety counseling: Secondary | ICD-10-CM | POA: Insufficient documentation

## 2016-09-30 DIAGNOSIS — D638 Anemia in other chronic diseases classified elsewhere: Secondary | ICD-10-CM | POA: Insufficient documentation

## 2016-09-30 DIAGNOSIS — Z0000 Encounter for general adult medical examination without abnormal findings: Secondary | ICD-10-CM | POA: Insufficient documentation

## 2016-09-30 DIAGNOSIS — Z66 Do not resuscitate: Secondary | ICD-10-CM | POA: Insufficient documentation

## 2017-06-15 DIAGNOSIS — Z9181 History of falling: Secondary | ICD-10-CM | POA: Insufficient documentation

## 2017-08-15 ENCOUNTER — Other Ambulatory Visit: Payer: Self-pay | Admitting: Family Medicine

## 2017-08-15 ENCOUNTER — Ambulatory Visit
Admission: RE | Admit: 2017-08-15 | Discharge: 2017-08-15 | Disposition: A | Discharge status: Home / Self Care | Payer: Medicare Other | Source: Ambulatory Visit | Attending: Family Medicine | Admitting: Family Medicine

## 2017-08-15 DIAGNOSIS — W19XXXA Unspecified fall, initial encounter: Secondary | ICD-10-CM

## 2017-08-15 DIAGNOSIS — M542 Cervicalgia: Secondary | ICD-10-CM | POA: Insufficient documentation

## 2017-08-15 DIAGNOSIS — R51 Headache: Secondary | ICD-10-CM | POA: Insufficient documentation

## 2017-08-15 DIAGNOSIS — H539 Unspecified visual disturbance: Secondary | ICD-10-CM

## 2017-08-15 DIAGNOSIS — R519 Headache, unspecified: Secondary | ICD-10-CM

## 2017-09-05 ENCOUNTER — Other Ambulatory Visit: Payer: Self-pay | Admitting: Family Medicine

## 2017-09-05 DIAGNOSIS — S0990XA Unspecified injury of head, initial encounter: Secondary | ICD-10-CM

## 2017-09-13 ENCOUNTER — Ambulatory Visit
Admission: RE | Admit: 2017-09-13 | Discharge: 2017-09-13 | Disposition: A | Discharge status: Home / Self Care | Payer: Medicare Other | Source: Ambulatory Visit | Attending: Family Medicine | Admitting: Family Medicine

## 2017-09-13 DIAGNOSIS — X58XXXS Exposure to other specified factors, sequela: Secondary | ICD-10-CM | POA: Diagnosis not present

## 2017-09-13 DIAGNOSIS — S0990XS Unspecified injury of head, sequela: Secondary | ICD-10-CM | POA: Diagnosis not present

## 2017-09-13 DIAGNOSIS — R930 Abnormal findings on diagnostic imaging of skull and head, not elsewhere classified: Secondary | ICD-10-CM | POA: Insufficient documentation

## 2017-09-13 DIAGNOSIS — S0990XA Unspecified injury of head, initial encounter: Secondary | ICD-10-CM

## 2017-09-18 DIAGNOSIS — K118 Other diseases of salivary glands: Secondary | ICD-10-CM | POA: Insufficient documentation

## 2018-06-21 ENCOUNTER — Encounter: Payer: Self-pay | Admitting: Podiatry

## 2018-06-21 ENCOUNTER — Ambulatory Visit (INDEPENDENT_AMBULATORY_CARE_PROVIDER_SITE_OTHER): Payer: Medicare Other | Admitting: Podiatry

## 2018-06-21 VITALS — BP 165/83 | HR 63

## 2018-06-21 DIAGNOSIS — M79675 Pain in left toe(s): Secondary | ICD-10-CM

## 2018-06-21 DIAGNOSIS — B351 Tinea unguium: Secondary | ICD-10-CM | POA: Diagnosis not present

## 2018-06-21 DIAGNOSIS — M79674 Pain in right toe(s): Secondary | ICD-10-CM

## 2018-06-21 NOTE — Progress Notes (Signed)
Complaint:  Visit Type: Patient presents  to my office for continued preventative foot care services. Complaint: Patient states" my nails have grown long and thick and become painful to walk and wear shoes"  The patient presents for preventative foot care services. No changes to ROS  Podiatric Exam: Vascular: dorsalis pedis and posterior tibial pulses are palpable bilateral except DP left foot. Capillary return is immediate. Temperature gradient is WNL. Skin turgor WNL  Sensorium: Normal Semmes Weinstein monofilament test. Normal tactile sensation bilaterally. Nail Exam: Pt has thick disfigured discolored nails with subungual debris noted bilateral entire nail hallux through fifth toenails Ulcer Exam: There is no evidence of ulcer or pre-ulcerative changes or infection. Orthopedic Exam: Muscle tone and strength are WNL. No limitations in general ROM. No crepitus or effusions noted. Foot type and digits show no abnormalities. Bony prominences are unremarkable. Skin: No Porokeratosis. No infection or ulcers  Diagnosis:  Onychomycosis, , Pain in right toe, pain in left toes  Treatment & Plan Procedures and Treatment: Consent by patient was obtained for treatment procedures.   Debridement of mycotic and hypertrophic toenails, 1 through 5 bilateral and clearing of subungual debris. No ulceration, no infection noted.  Return Visit-Office Procedure: Patient instructed to return to the office for a follow up visit 3 months for continued evaluation and treatment.    Gardiner Barefoot DPM

## 2018-11-07 ENCOUNTER — Emergency Department
Admission: EM | Admit: 2018-11-07 | Discharge: 2018-11-07 | Disposition: A | Discharge status: Home / Self Care | Payer: Medicare Other | Attending: Emergency Medicine | Admitting: Emergency Medicine

## 2018-11-07 ENCOUNTER — Encounter: Payer: Self-pay | Admitting: Emergency Medicine

## 2018-11-07 ENCOUNTER — Other Ambulatory Visit: Payer: Self-pay

## 2018-11-07 ENCOUNTER — Emergency Department: Payer: Medicare Other

## 2018-11-07 DIAGNOSIS — Z79899 Other long term (current) drug therapy: Secondary | ICD-10-CM | POA: Diagnosis not present

## 2018-11-07 DIAGNOSIS — Y9389 Activity, other specified: Secondary | ICD-10-CM | POA: Insufficient documentation

## 2018-11-07 DIAGNOSIS — Y998 Other external cause status: Secondary | ICD-10-CM | POA: Diagnosis not present

## 2018-11-07 DIAGNOSIS — Y9289 Other specified places as the place of occurrence of the external cause: Secondary | ICD-10-CM | POA: Insufficient documentation

## 2018-11-07 DIAGNOSIS — I10 Essential (primary) hypertension: Secondary | ICD-10-CM | POA: Diagnosis not present

## 2018-11-07 DIAGNOSIS — S0990XA Unspecified injury of head, initial encounter: Secondary | ICD-10-CM

## 2018-11-07 DIAGNOSIS — S0001XA Abrasion of scalp, initial encounter: Secondary | ICD-10-CM | POA: Diagnosis not present

## 2018-11-07 DIAGNOSIS — F17290 Nicotine dependence, other tobacco product, uncomplicated: Secondary | ICD-10-CM | POA: Diagnosis not present

## 2018-11-07 DIAGNOSIS — Z8673 Personal history of transient ischemic attack (TIA), and cerebral infarction without residual deficits: Secondary | ICD-10-CM | POA: Diagnosis not present

## 2018-11-07 DIAGNOSIS — W01198A Fall on same level from slipping, tripping and stumbling with subsequent striking against other object, initial encounter: Secondary | ICD-10-CM | POA: Diagnosis not present

## 2018-11-07 LAB — BASIC METABOLIC PANEL
Anion gap: 9 (ref 5–15)
BUN: 32 mg/dL — ABNORMAL HIGH (ref 8–23)
CO2: 25 mmol/L (ref 22–32)
Calcium: 9.4 mg/dL (ref 8.9–10.3)
Chloride: 106 mmol/L (ref 98–111)
Creatinine, Ser: 1.43 mg/dL — ABNORMAL HIGH (ref 0.61–1.24)
GFR calc Af Amer: 48 mL/min — ABNORMAL LOW (ref >60)
GFR calc non Af Amer: 41 mL/min — ABNORMAL LOW (ref >60)
Glucose, Bld: 97 mg/dL (ref 70–99)
Potassium: 4.5 mmol/L (ref 3.5–5.1)
Sodium: 140 mmol/L (ref 135–145)

## 2018-11-07 LAB — URINALYSIS, COMPLETE (UACMP) WITH MICROSCOPIC
Bilirubin Urine: NEGATIVE
Glucose, UA: NEGATIVE mg/dL
Hgb urine dipstick: NEGATIVE
Ketones, ur: NEGATIVE mg/dL
Nitrite: NEGATIVE
Protein, ur: NEGATIVE mg/dL
Specific Gravity, Urine: 1.014 (ref 1.005–1.030)
Squamous Epithelial / LPF: NONE SEEN (ref 0–5)
pH: 7 (ref 5.0–8.0)

## 2018-11-07 LAB — CBC
HCT: 41 % (ref 39.0–52.0)
Hemoglobin: 13.4 g/dL (ref 13.0–17.0)
MCH: 31.3 pg (ref 26.0–34.0)
MCHC: 32.7 g/dL (ref 30.0–36.0)
MCV: 95.8 fL (ref 80.0–100.0)
Platelets: 214 10*3/uL (ref 150–400)
RBC: 4.28 MIL/uL (ref 4.22–5.81)
RDW: 12.6 % (ref 11.5–15.5)
WBC: 8.3 10*3/uL (ref 4.0–10.5)
nRBC: 0 % (ref 0.0–0.2)

## 2018-11-07 NOTE — Discharge Instructions (Addendum)
Return to the ER for new, worsening, or persistent severe headache, confusion, weakness, difficulty walking, worsening problems with balance, or any other new or worsening symptoms that concern you.

## 2018-11-07 NOTE — ED Triage Notes (Signed)
Pt here via EMS from [facility] where he lives with his wife with c/o fall x2 this am, first fall states he was leaning too far forward, lost his balance and fell forward, no injury noted, second fall, he was loading a walker into his car, lost his footing and fell to the cement sidewalk, hitting the back of his head, small lac noted, bleeding controlled, pt is on a blood thinner. A&Ox4. Only complaint is a mild headache. Pt is HOH.

## 2018-11-07 NOTE — ED Provider Notes (Signed)
[facility] Emergency Department Provider Note ____________________________________________   First MD Initiated Contact with Patient 11/07/18 1235     (approximate)  I have reviewed the triage vital signs and the nursing notes.   HISTORY  Chief Complaint Fall and Head Laceration    HPI Evan Cole. is a 85 y.o. male with PMH as noted below who presents with an injury to the back of his head after fall, acute onset this morning after he lost his footing while loading a walker into his car, causing him to fall backwards and hit his head.  He states that earlier he also had a fall when he was leaning too far to reach for something and lost his balance.  He denies any lightheadedness or near syncope during these episodes.  He states that his overall exercise tolerance has gradually decreased over many months, but he denies any acute weakness, dizziness, shortness of breath, or other precipitating symptoms.  He has pain to the back of his head but denies any other injuries.  Past Medical History:  Diagnosis Date  . Frequent falls   . Hypertension   . Shoulder dislocation   . TIA (transient ischemic attack)     There are no active problems to display for this patient.   Past Surgical History:  Procedure Laterality Date  . APPENDECTOMY    . HERNIA REPAIR      Prior to Admission medications   Medication Sig Start Date End Date Taking? Authorizing Provider  albuterol (PROAIR HFA) 108 (90 Base) MCG/ACT inhaler ProAir HFA 90 mcg/actuation aerosol inhaler  INHALE 2 PUFFS INTO THE LUNGS EVERY 4 HOURS AS NEEDED FOR WHEEZING OR SHORTNESS OF BREATH    [provider]  atorvastatin (LIPITOR) 40 MG tablet TAKE 1 TABLET BY MOUTH EVERY DAY 05/03/18   [provider]  cefUROXime (CEFTIN) 500 MG tablet cefuroxime axetil 500 mg tablet    [provider]  citalopram (CELEXA) 20 MG tablet citalopram 20 mg tablet 06/25/15   [provider]  dipyridamole-aspirin (AGGRENOX) 200-25 MG 12hr capsule aspirin 25 mg-dipyridamole 200 mg capsule,ext.release 12 hr multiphase 06/22/15   [provider]  fexofenadine (ALLEGRA) 180 MG tablet fexofenadine 180 mg tablet  TAKE 1 TABLET (180 MG TOTAL) BY MOUTH ONCE DAILY. *OTC - NOT COVERED*    [provider]  fluvastatin (LESCOL) 40 MG capsule fluvastatin 40 mg capsule 04/20/15   [provider]  hydrochlorothiazide (HYDRODIURIL) 25 MG tablet hydrochlorothiazide 25 mg tablet 03/19/15   [provider]  Influenza vac split quadrivalent PF (FLUZONE HIGH-DOSE) 0.5 ML injection Fluzone High-Dose 2016-2017 (PF) 180 mcg/0.5 mL intramuscular syringe  TO BE ADMINISTERED BY PHARMACIST FOR IMMUNIZATION    [provider]  losartan (COZAAR) 50 MG tablet TAKE 1 TABLET BY MOUTH EVERYDAY AT BEDTIME 05/12/18   [provider]  MAGNESIUM CARBONATE PO Take by mouth.    [provider]  Melatonin 3 MG TABS Take by mouth.    [provider]  mirabegron ER (MYRBETRIQ) 25 MG TB24 tablet Myrbetriq 25 mg tablet,extended release 02/21/18   [provider]  predniSONE (DELTASONE) 10 MG tablet prednisone 10 mg tablet    [provider]  rOPINIRole (REQUIP) 0.25 MG tablet ropinirole 0.25 mg tablet 05/28/15   [provider]  sertraline (ZOLOFT) 100 MG tablet Take 100 mg by mouth daily. 02/19/18   [provider]  UNABLE TO FIND Take by mouth. 05/07/10   [provider]  UNABLE TO FIND nitrofurantoin monohydrate/macrocrystals 100 mg capsule    [provider]  UNABLE TO FIND omeprazole 20 mg capsule,delayed release    [provider]  UNABLE TO FIND Pulmicort Flexhaler 180 mcg/actuation breath activated    [provider]  UNABLE TO FIND Pulmicort Flexhaler 180 mcg/actuation breath activated    [provider]    Allergies Doxycycline, Benzonatate, Simvastatin,  Tetanus toxoid, and Codeine  No family history on file.  Social History Social History   Tobacco Use  . Smoking status: Current Every Day Smoker    Types: Pipe  . Smokeless tobacco: Current User  Substance Use Topics  . Alcohol use: Yes    Comment: 1 beer daily w/ dinner  . Drug use: Never    Review of Systems  Constitutional: No fever.  No fatigue. Eyes: No redness. ENT: No sore throat.  No neck pain. Cardiovascular: Denies chest pain. Respiratory: Denies shortness of breath. Gastrointestinal: No vomiting or diarrhea.  Genitourinary: Negative for dysuria.  Musculoskeletal: Negative for back pain. Skin: Negative for rash. Neurological: Negative for headaches, focal weakness or numbness.   ____________________________________________   PHYSICAL EXAM:  VITAL SIGNS: ED Triage Vitals  Enc Vitals Group     BP 11/07/18 1229 (!) 194/92     Pulse Rate 11/07/18 1229 (!) 59     Resp 11/07/18 1229 16     Temp 11/07/18 1229 98.5 F (36.9 C)     Temp Source 11/07/18 1229 Oral     SpO2 11/07/18 1229 99 %     Weight 11/07/18 1231 155 lb (70.3 kg)     Height 11/07/18 1231 5\' 10"  (1.778 m)     Head Circumference --      Peak Flow --      Pain Score 11/07/18 1230 2     Pain Loc --      Pain Edu? --      Excl. in [place]? --     Constitutional: Alert and oriented. Well appearing for age and in no acute distress. Eyes: Conjunctivae are normal.  EOMI.  PERRLA. Head: 5 cm superficial parieto-occipital hematoma.  No scalp laceration. Nose: No congestion/rhinnorhea. Mouth/Throat: Mucous membranes are moist.   Neck: Normal range of motion.  No midline cervical spinal tenderness, step-off, or crepitus. Cardiovascular: Normal rate, regular rhythm. Good peripheral circulation. Respiratory: Normal respiratory effort.  No retractions. Gastrointestinal:  No distention.  Musculoskeletal: No lower extremity edema.  Extremities warm and well perfused.  No midline spinal tenderness.  Full  range of motion of bilateral shoulders, elbows, wrists, hips, knees, and ankles. Neurologic:  Normal speech and language.  Motor intact in all extremities.  Normal coordination.  No gross focal neurologic deficits are appreciated.  Skin:  Skin is warm and dry. No rash noted. Psychiatric: Mood and affect are normal. Speech and behavior are normal.  ____________________________________________   LABS (all labs ordered are listed, but only abnormal results are displayed)  Labs Reviewed  BASIC METABOLIC PANEL - Abnormal; Notable for the following components:      Result Value   BUN 32 (*)    Creatinine, Ser 1.43 (*)    GFR calc non Af Amer 41 (*)    GFR calc Af Amer 48 (*)    All other components within normal limits  URINALYSIS, COMPLETE (UACMP) WITH MICROSCOPIC - Abnormal; Notable for the following components:   Color, Urine YELLOW (*)    APPearance HAZY (*)    Leukocytes,Ua TRACE (*)  Bacteria, UA RARE (*)    All other components within normal limits  CBC   ____________________________________________  EKG   ____________________________________________  RADIOLOGY  CT head: No ICH or other acute traumatic findings  ____________________________________________   PROCEDURES  Procedure(s) performed: No  Procedures  Critical Care performed: No ____________________________________________   INITIAL IMPRESSION / ASSESSMENT AND PLAN / ED COURSE  Pertinent labs & imaging results that were available during my care of the patient were reviewed by me and considered in my medical decision making (see chart for details).  85 year old male with PMH as noted above presents after 2 mechanical falls in which she lost his balance this morning.  The patient denies any prodromal symptoms and states he has not been feeling weak or ill recently.  He reports hitting his head during one of the falls.  During the other fall he states he just sat down.  Other than some into the back of  his head, he is asymptomatic currently.  On exam, he is very well-appearing for his age.  He is hypertensive with otherwise normal vital signs.  Neurologic exam is nonfocal.  The only visible trauma is a superficial hematoma at the back of the head.  He has no midline spinal tenderness.  He has good range of motion in all extremities.  We will obtain a CT as the patient is on aspirin (he reports being on a blood thinner but I do not see any others listed on his medication list).  There is no indication for spinal imaging.  We will obtain basic labs and a UA just to rule out any precipitating medical etiologies although there are no symptoms to suggest this.  ----------------------------------------- 2:33 PM on 11/07/2018 -----------------------------------------  CT head is negative, and the lab work-up is unremarkable.  The patient continues to feel comfortable.  He is stable for discharge home at this time.  I counseled him on the results of the work-up.  Return precautions given, and he expresses understanding.  ____________________________________________   FINAL CLINICAL IMPRESSION(S) / ED DIAGNOSES  Final diagnoses:  Minor head injury, initial encounter  Abrasion of scalp, initial encounter      NEW MEDICATIONS STARTED DURING THIS VISIT:  New Prescriptions   No medications on file     Note:  This document was prepared using Dragon voice recognition software and may include unintentional dictation errors.    Arta Silence, MD 11/07/18 1433

## 2019-01-14 ENCOUNTER — Ambulatory Visit (INDEPENDENT_AMBULATORY_CARE_PROVIDER_SITE_OTHER): Payer: Medicare Other | Admitting: Podiatry

## 2019-01-14 ENCOUNTER — Other Ambulatory Visit: Payer: Self-pay

## 2019-01-14 ENCOUNTER — Encounter: Payer: Self-pay | Admitting: Podiatry

## 2019-01-14 VITALS — Temp 98.2°F

## 2019-01-14 DIAGNOSIS — M79675 Pain in left toe(s): Secondary | ICD-10-CM

## 2019-01-14 DIAGNOSIS — B351 Tinea unguium: Secondary | ICD-10-CM

## 2019-01-14 DIAGNOSIS — M79674 Pain in right toe(s): Secondary | ICD-10-CM | POA: Diagnosis not present

## 2019-01-14 NOTE — Progress Notes (Signed)
Complaint:  Visit Type: Patient presents  to my office for continued preventative foot care services. Complaint: Patient states" my nails have grown long and thick and become painful to walk and wear shoes"  The patient presents for preventative foot care services. No changes to ROS  Podiatric Exam: Vascular: dorsalis pedis and posterior tibial pulses are palpable bilateral except DP left foot. Capillary return is immediate. Temperature gradient is WNL. Skin turgor WNL  Sensorium: Normal Semmes Weinstein monofilament test. Normal tactile sensation bilaterally. Nail Exam: Pt has thick disfigured discolored nails with subungual debris noted bilateral entire nail hallux through fifth toenails Ulcer Exam: There is no evidence of ulcer or pre-ulcerative changes or infection. Orthopedic Exam: Muscle tone and strength are WNL. No limitations in general ROM. No crepitus or effusions noted. Foot type and digits show no abnormalities. Bony prominences are unremarkable. Skin: No Porokeratosis. No infection or ulcers  Diagnosis:  Onychomycosis, , Pain in right toe, pain in left toes  Treatment & Plan Procedures and Treatment: Consent by patient was obtained for treatment procedures.   Debridement of mycotic and hypertrophic toenails, 1 through 5 bilateral and clearing of subungual debris. No ulceration, no infection noted.  Return Visit-Office Procedure: Patient instructed to return to the office for a follow up visit 3 months for continued evaluation and treatment.    Gardiner Barefoot DPM

## 2019-04-15 ENCOUNTER — Encounter: Payer: Self-pay | Admitting: Podiatry

## 2019-04-15 ENCOUNTER — Ambulatory Visit (INDEPENDENT_AMBULATORY_CARE_PROVIDER_SITE_OTHER): Payer: Medicare Other | Admitting: Podiatry

## 2019-04-15 ENCOUNTER — Other Ambulatory Visit: Payer: Self-pay

## 2019-04-15 DIAGNOSIS — M79674 Pain in right toe(s): Secondary | ICD-10-CM | POA: Diagnosis not present

## 2019-04-15 DIAGNOSIS — B351 Tinea unguium: Secondary | ICD-10-CM

## 2019-04-15 DIAGNOSIS — M79675 Pain in left toe(s): Secondary | ICD-10-CM

## 2019-04-15 NOTE — Progress Notes (Signed)
Complaint:  Visit Type: Patient presents  to my office for continued preventative foot care services. Complaint: Patient states" my nails have grown long and thick and become painful to walk and wear shoes"  The patient presents for preventative foot care services. No changes to ROS  Podiatric Exam: Vascular: dorsalis pedis and posterior tibial pulses are palpable bilateral except DP left foot. Capillary return is immediate. Temperature gradient is WNL. Skin turgor WNL  Sensorium: Normal Semmes Weinstein monofilament test. Normal tactile sensation bilaterally. Nail Exam: Pt has thick disfigured discolored nails with subungual debris noted bilateral entire nail hallux through fifth toenails Ulcer Exam: There is no evidence of ulcer or pre-ulcerative changes or infection. Orthopedic Exam: Muscle tone and strength are WNL. No limitations in general ROM. No crepitus or effusions noted. Foot type and digits show no abnormalities. Bony prominences are unremarkable. Skin: No Porokeratosis. No infection or ulcers  Diagnosis:  Onychomycosis, , Pain in right toe, pain in left toes  Treatment & Plan Procedures and Treatment: Consent by patient was obtained for treatment procedures.   Debridement of mycotic and hypertrophic toenails, 1 through 5 bilateral and clearing of subungual debris. No ulceration, no infection noted.  Return Visit-Office Procedure: Patient instructed to return to the office for a follow up visit 3 months for continued evaluation and treatment.    Gardiner Barefoot DPM

## 2019-07-15 ENCOUNTER — Other Ambulatory Visit: Payer: Self-pay

## 2019-07-15 ENCOUNTER — Encounter: Payer: Self-pay | Admitting: Podiatry

## 2019-07-15 ENCOUNTER — Ambulatory Visit (INDEPENDENT_AMBULATORY_CARE_PROVIDER_SITE_OTHER): Payer: Medicare Other | Admitting: Podiatry

## 2019-07-15 DIAGNOSIS — B351 Tinea unguium: Secondary | ICD-10-CM | POA: Diagnosis not present

## 2019-07-15 DIAGNOSIS — M79674 Pain in right toe(s): Secondary | ICD-10-CM

## 2019-07-15 DIAGNOSIS — M79675 Pain in left toe(s): Secondary | ICD-10-CM

## 2019-07-15 NOTE — Progress Notes (Signed)
Complaint:  Visit Type: Patient presents  to my office for continued preventative foot care services. Complaint: Patient states" my nails have grown long and thick and become painful to walk and wear shoes"  The patient presents for preventative foot care services. No changes to ROS  Podiatric Exam: Vascular: dorsalis pedis and posterior tibial pulses are palpable bilateral except DP left foot. Capillary return is immediate. Temperature gradient is WNL. Skin turgor WNL  Sensorium: Normal Semmes Weinstein monofilament test. Normal tactile sensation bilaterally. Nail Exam: Pt has thick disfigured discolored nails with subungual debris noted bilateral entire nail hallux through fifth toenails Ulcer Exam: There is no evidence of ulcer or pre-ulcerative changes or infection. Orthopedic Exam: Muscle tone and strength are WNL. No limitations in general ROM. No crepitus or effusions noted. Foot type and digits show no abnormalities. Bony prominences are unremarkable. Skin: No Porokeratosis. No infection or ulcers  Diagnosis:  Onychomycosis, , Pain in right toe, pain in left toes  Treatment & Plan Procedures and Treatment: Consent by patient was obtained for treatment procedures.   Debridement of mycotic and hypertrophic toenails, 1 through 5 bilateral and clearing of subungual debris. No ulceration, no infection noted.  Return Visit-Office Procedure: Patient instructed to return to the office for a follow up visit 3 months for continued evaluation and treatment.    Gardiner Barefoot DPM

## 2019-08-22 DIAGNOSIS — N3941 Urge incontinence: Secondary | ICD-10-CM | POA: Insufficient documentation

## 2019-10-17 ENCOUNTER — Ambulatory Visit: Payer: Medicare Other | Admitting: Podiatry

## 2019-12-26 ENCOUNTER — Other Ambulatory Visit: Payer: Self-pay

## 2019-12-26 ENCOUNTER — Other Ambulatory Visit: Payer: Self-pay | Admitting: Physician Assistant

## 2019-12-26 ENCOUNTER — Ambulatory Visit
Admission: RE | Admit: 2019-12-26 | Discharge: 2019-12-26 | Disposition: A | Discharge status: Home / Self Care | Payer: Medicare Other | Source: Ambulatory Visit | Attending: Physician Assistant | Admitting: Physician Assistant

## 2019-12-26 DIAGNOSIS — M7989 Other specified soft tissue disorders: Secondary | ICD-10-CM | POA: Insufficient documentation

## 2020-03-30 ENCOUNTER — Emergency Department: Payer: Medicare Other

## 2020-03-30 ENCOUNTER — Other Ambulatory Visit: Payer: Self-pay

## 2020-03-30 ENCOUNTER — Emergency Department
Admission: EM | Admit: 2020-03-30 | Discharge: 2020-03-30 | Disposition: A | Discharge status: Home / Self Care | Payer: Medicare Other | Attending: Emergency Medicine | Admitting: Emergency Medicine

## 2020-03-30 ENCOUNTER — Encounter: Payer: Self-pay | Admitting: Medical Oncology

## 2020-03-30 DIAGNOSIS — F17210 Nicotine dependence, cigarettes, uncomplicated: Secondary | ICD-10-CM | POA: Diagnosis not present

## 2020-03-30 DIAGNOSIS — I10 Essential (primary) hypertension: Secondary | ICD-10-CM | POA: Diagnosis not present

## 2020-03-30 DIAGNOSIS — S0990XA Unspecified injury of head, initial encounter: Secondary | ICD-10-CM | POA: Diagnosis present

## 2020-03-30 DIAGNOSIS — W19XXXA Unspecified fall, initial encounter: Secondary | ICD-10-CM

## 2020-03-30 DIAGNOSIS — E860 Dehydration: Secondary | ICD-10-CM | POA: Diagnosis not present

## 2020-03-30 DIAGNOSIS — Z79899 Other long term (current) drug therapy: Secondary | ICD-10-CM | POA: Insufficient documentation

## 2020-03-30 DIAGNOSIS — W010XXA Fall on same level from slipping, tripping and stumbling without subsequent striking against object, initial encounter: Secondary | ICD-10-CM | POA: Insufficient documentation

## 2020-03-30 DIAGNOSIS — S0083XA Contusion of other part of head, initial encounter: Secondary | ICD-10-CM | POA: Diagnosis not present

## 2020-03-30 LAB — CBC WITH DIFFERENTIAL/PLATELET
Abs Immature Granulocytes: 0.02 10*3/uL (ref 0.00–0.07)
Basophils Absolute: 0.1 10*3/uL (ref 0.0–0.1)
Basophils Relative: 1 %
Eosinophils Absolute: 0.2 10*3/uL (ref 0.0–0.5)
Eosinophils Relative: 2 %
HCT: 39.2 % (ref 39.0–52.0)
Hemoglobin: 12.8 g/dL — ABNORMAL LOW (ref 13.0–17.0)
Immature Granulocytes: 0 %
Lymphocytes Relative: 19 %
Lymphs Abs: 1.3 10*3/uL (ref 0.7–4.0)
MCH: 30.5 pg (ref 26.0–34.0)
MCHC: 32.7 g/dL (ref 30.0–36.0)
MCV: 93.6 fL (ref 80.0–100.0)
Monocytes Absolute: 0.8 10*3/uL (ref 0.1–1.0)
Monocytes Relative: 11 %
Neutro Abs: 4.6 10*3/uL (ref 1.7–7.7)
Neutrophils Relative %: 67 %
Platelets: 224 10*3/uL (ref 150–400)
RBC: 4.19 MIL/uL — ABNORMAL LOW (ref 4.22–5.81)
RDW: 13.2 % (ref 11.5–15.5)
WBC: 6.9 10*3/uL (ref 4.0–10.5)
nRBC: 0 % (ref 0.0–0.2)

## 2020-03-30 LAB — COMPREHENSIVE METABOLIC PANEL
ALT: 21 U/L (ref 0–44)
AST: 28 U/L (ref 15–41)
Albumin: 4.1 g/dL (ref 3.5–5.0)
Alkaline Phosphatase: 39 U/L (ref 38–126)
Anion gap: 7 (ref 5–15)
BUN: 33 mg/dL — ABNORMAL HIGH (ref 8–23)
CO2: 27 mmol/L (ref 22–32)
Calcium: 9.5 mg/dL (ref 8.9–10.3)
Chloride: 104 mmol/L (ref 98–111)
Creatinine, Ser: 1.56 mg/dL — ABNORMAL HIGH (ref 0.61–1.24)
GFR, Estimated: 40 mL/min — ABNORMAL LOW (ref >60)
Glucose, Bld: 96 mg/dL (ref 70–99)
Potassium: 4.1 mmol/L (ref 3.5–5.1)
Sodium: 138 mmol/L (ref 135–145)
Total Bilirubin: 1 mg/dL (ref 0.3–1.2)
Total Protein: 6.3 g/dL — ABNORMAL LOW (ref 6.5–8.1)

## 2020-03-30 LAB — URINALYSIS, COMPLETE (UACMP) WITH MICROSCOPIC
Bacteria, UA: NONE SEEN
Bilirubin Urine: NEGATIVE
Glucose, UA: NEGATIVE mg/dL
Hgb urine dipstick: NEGATIVE
Ketones, ur: NEGATIVE mg/dL
Leukocytes,Ua: NEGATIVE
Nitrite: NEGATIVE
Protein, ur: NEGATIVE mg/dL
Specific Gravity, Urine: 1.013 (ref 1.005–1.030)
Squamous Epithelial / HPF: NONE SEEN (ref 0–5)
pH: 6 (ref 5.0–8.0)

## 2020-03-30 LAB — TROPONIN I (HIGH SENSITIVITY)
Troponin I (High Sensitivity): 12 ng/L (ref <18)
Troponin I (High Sensitivity): 15 ng/L (ref <18)

## 2020-03-30 MED ORDER — SODIUM CHLORIDE 0.9 % IV BOLUS
1000.0000 mL | Freq: Once | INTRAVENOUS | Status: AC
  Administered 2020-03-30: 1000 mL via INTRAVENOUS

## 2020-03-30 MED ORDER — LOSARTAN POTASSIUM 50 MG PO TABS
50.0000 mg | ORAL_TABLET | Freq: Once | ORAL | Status: AC
  Administered 2020-03-30: 50 mg via ORAL
  Filled 2020-03-30: qty 1

## 2020-03-30 NOTE — ED Provider Notes (Signed)
[facility] Emergency Department Provider Note  ____________________________________________   First MD Initiated Contact with Patient 03/30/20 [phone]     (approximate)  I have reviewed the triage vital signs and the nursing notes.   HISTORY  Chief Complaint Fall and Head Injury    HPI Gershon Shorten. is a 86 y.o. male here with fall.  The patient states that he was getting up this morning to tucking his shirt.  He states that when he stood up, his knees got weak, causing him to fall forward.  He states that he hit his head.  He did not lose consciousness.  He has a mild headache, but denies any neck pain.  No upper extremity numbness or weakness.  Patient does admit that he has been falling more recently.  Discussed with patient's daughter, Judson Roch, via telephone.  She states that the patient has had multiple falls over the last 2 weeks.  He has recently begun seeing physical therapy for work with strengthening his lower extremities, as he seems to fall when he stands up.  She states that he has not had any increasing confusion.  No known fevers or chills.  No known recent medication changes.  No other acute complaints.      Clarise Cruz, daughter, Chauncey Reading [phone]   Past Medical History:  Diagnosis Date  . Frequent falls   . Hypertension   . Shoulder dislocation   . TIA (transient ischemic attack)     There are no problems to display for this patient.   Past Surgical History:  Procedure Laterality Date  . APPENDECTOMY    . HERNIA REPAIR      Prior to Admission medications   Medication Sig Start Date End Date Taking? Authorizing Provider  albuterol (PROAIR HFA) 108 (90 Base) MCG/ACT inhaler ProAir HFA 90 mcg/actuation aerosol inhaler  INHALE 2 PUFFS INTO THE LUNGS EVERY 4 HOURS AS NEEDED FOR WHEEZING OR SHORTNESS OF BREATH    [provider]  atorvastatin (LIPITOR) 40 MG tablet TAKE 1 TABLET BY MOUTH EVERY DAY 05/03/18   [provider]  cefUROXime (CEFTIN) 500 MG tablet cefuroxime axetil 500 mg tablet    [provider]  citalopram (CELEXA) 20 MG tablet citalopram 20 mg tablet 06/25/15   [provider]  dipyridamole-aspirin (AGGRENOX) 200-25 MG 12hr capsule aspirin 25 mg-dipyridamole 200 mg capsule,ext.release 12 hr multiphase 06/22/15   [provider]  fexofenadine (ALLEGRA) 180 MG tablet fexofenadine 180 mg tablet  TAKE 1 TABLET (180 MG TOTAL) BY MOUTH ONCE DAILY. *OTC - NOT COVERED*    [provider]  fluvastatin (LESCOL) 40 MG capsule fluvastatin 40 mg capsule 04/20/15   [provider]  Influenza vac split quadrivalent PF (FLUZONE HIGH-DOSE) 0.5 ML injection Fluzone High-Dose 2016-2017 (PF) 180 mcg/0.5 mL intramuscular syringe  TO BE ADMINISTERED BY PHARMACIST FOR IMMUNIZATION    [provider]  losartan (COZAAR) 50 MG tablet TAKE 1 TABLET BY MOUTH EVERYDAY AT BEDTIME 05/12/18   [provider]  MAGNESIUM CARBONATE PO Take by mouth.    [provider]  Melatonin 3 MG TABS Take by mouth.    [provider]  mirabegron ER (MYRBETRIQ) 25 MG TB24 tablet Myrbetriq 25 mg tablet,extended release 02/21/18   [provider]  predniSONE (DELTASONE) 10 MG tablet prednisone 10 mg tablet    [provider]  rOPINIRole (REQUIP) 0.25 MG tablet ropinirole 0.25 mg tablet 05/28/15   [provider]  sertraline (ZOLOFT) 100 MG  tablet Take 100 mg by mouth daily. 02/19/18   [provider]  UNABLE TO FIND Take by mouth. 05/07/10   [provider]  UNABLE TO FIND nitrofurantoin monohydrate/macrocrystals 100 mg capsule    [provider]  UNABLE TO FIND omeprazole 20 mg capsule,delayed release    [provider]  UNABLE TO FIND Pulmicort Flexhaler 180 mcg/actuation breath activated    [provider]  UNABLE TO FIND Pulmicort Flexhaler 180 mcg/actuation breath activated    [provider]  hydrochlorothiazide (HYDRODIURIL) 25 MG tablet hydrochlorothiazide 25 mg tablet 03/19/15 03/30/20  [provider]    Allergies Doxycycline, Benzonatate, Simvastatin, Tetanus toxoid, and Codeine  No family history on file.  Social History Social History   Tobacco Use  . Smoking status: Current Every Day Smoker    Types: Pipe  . Smokeless tobacco: Current User  Substance Use Topics  . Alcohol use: Yes    Comment: 1 beer daily w/ dinner  . Drug use: Never    Review of Systems  Review of Systems  Constitutional: Positive for fatigue. Negative for chills and fever.  HENT: Negative for sore throat.   Respiratory: Negative for shortness of breath.   Cardiovascular: Negative for chest pain.  Gastrointestinal: Negative for abdominal pain.  Genitourinary: Negative for flank pain.  Musculoskeletal: Negative for neck pain.  Skin: Negative for rash and wound.  Allergic/Immunologic: Negative for immunocompromised state.  Neurological: Positive for weakness and headaches. Negative for numbness.  Hematological: Does not bruise/bleed easily.  All other systems reviewed and are negative.    ____________________________________________  PHYSICAL EXAM:      VITAL SIGNS: ED Triage Vitals  Enc Vitals Group     BP 03/30/20 0905 (!) 202/82     Pulse Rate 03/30/20 0905 61     Resp 03/30/20 0905 18     Temp 03/30/20 0905 98.7 F (37.1 C)     Temp Source 03/30/20 0905 Oral     SpO2 03/30/20 0905 99 %     Weight 03/30/20 0910 154 lb 5.2 oz (70 kg)     Height 03/30/20 0910 5\' 10"  (1.778 m)     Head Circumference --      Peak Flow --      Pain Score 03/30/20 0909 3     Pain Loc --      Pain Edu? --      Excl. in [place]? --      Physical Exam Vitals and nursing note reviewed.  Constitutional:      General: He is not in acute distress.    Appearance: He is well-developed.  HENT:     Head: Normocephalic and atraumatic.     Comments: Small hematoma to the anterior,  superior forehead.  No surrounding crepitance.  No periorbital or postauricular ecchymoses.  No hemotympanum. Eyes:     Conjunctiva/sclera: Conjunctivae normal.  Neck:     Comments: No midline or paraspinal tenderness Cardiovascular:     Rate and Rhythm: Normal rate and regular rhythm.     Heart sounds: Normal heart sounds. No murmur heard.  No friction rub.  Pulmonary:     Effort: Pulmonary effort is normal. No respiratory distress.     Breath sounds: Normal breath sounds. No wheezing or rales.  Abdominal:     General: There is no distension.     Palpations: Abdomen is soft.     Tenderness: There is no abdominal tenderness.  Musculoskeletal:     Cervical back:  Neck supple.  Skin:    General: Skin is warm.     Capillary Refill: Capillary refill takes less than 2 seconds.  Neurological:     Mental Status: He is alert and oriented to person, place, and time.     Motor: No abnormal muscle tone.       ____________________________________________   LABS (all labs ordered are listed, but only abnormal results are displayed)  Labs Reviewed  CBC WITH DIFFERENTIAL/PLATELET - Abnormal; Notable for the following components:      Result Value   RBC 4.19 (*)    Hemoglobin 12.8 (*)    All other components within normal limits  COMPREHENSIVE METABOLIC PANEL - Abnormal; Notable for the following components:   BUN 33 (*)    Creatinine, Ser 1.56 (*)    Total Protein 6.3 (*)    GFR, Estimated 40 (*)    All other components within normal limits  URINALYSIS, COMPLETE (UACMP) WITH MICROSCOPIC - Abnormal; Notable for the following components:   Color, Urine STRAW (*)    APPearance CLEAR (*)    All other components within normal limits  TROPONIN I (HIGH SENSITIVITY)  TROPONIN I (HIGH SENSITIVITY)    ____________________________________________  EKG: Normal sinus rhythm, ventricular rate 61.  QRS 104, QTc 455.  Left axis deviation.  No acute ST elevations or depressions.  No EKG  evidence of acute ischemia or infarct. ________________________________________  RADIOLOGY All imaging, including plain films, CT scans, and ultrasounds, independently reviewed by me, and interpretations confirmed via formal radiology reads.  ED MD interpretation:   CT head: No acute abnormality CT C-spine: Degenerative changes, no acute fracture Chest x-ray: Clear, no focal abnormality  Official radiology report(s): CT Head Wo Contrast  Result Date: 03/30/2020 CLINICAL DATA:  Trauma.  Fall. EXAM: CT HEAD WITHOUT CONTRAST CT CERVICAL SPINE WITHOUT CONTRAST TECHNIQUE: Multidetector CT imaging of the head and cervical spine was performed following the standard protocol without intravenous contrast. Multiplanar CT image reconstructions of the cervical spine were also generated. COMPARISON:  CT August 15, 2017. FINDINGS: CT HEAD FINDINGS Brain: No evidence of acute infarction, hemorrhage, hydrocephalus, extra-axial collection or mass lesion/mass effect. Similar patchy white matter hypoattenuation, most consistent with chronic microvascular ischemic change. Similar generalized atrophy with ex vacuo ventricular dilation. Vascular: Calcific atherosclerosis. Skull: No acute fracture.  Frontal scalp contusion Sinuses/Orbits: Mild scattered paranasal sinus mucosal thickening without air-fluid levels. No evidence of acute orbital abnormality. Other: No mastoid effusions. CT CERVICAL SPINE FINDINGS Alignment: Normal. Skull base and vertebrae: No acute fracture. No new vertebral body height loss. No primary bone lesion or focal pathologic process. Soft tissues and spinal canal: No prevertebral fluid or swelling. No visible canal hematoma. Disc levels: Multilevel degenerative change, including facet uncovertebral hypertrophy with possibly severe right foraminal stenosis at C4-C5. Upper chest: Negative. Other: Atherosclerotic vascular calcifications. IMPRESSION: CT head: 1. No evidence of acute intracranial  abnormality. 2. Frontal scalp contusion without acute fracture. 3. Chronic microvascular ischemic change and generalized atrophy. CT cervical spine: 1. No evidence of acute fracture or traumatic malalignment. 2. Multilevel degenerative change, including possibly severe right foraminal stenosis C4-C5. MRI of the cervical spine could further characterize if clinically indicated. Electronically Signed   By: Margaretha Sheffield MD   On: 03/30/2020 09:48   CT Cervical Spine Wo Contrast  Result Date: 03/30/2020 CLINICAL DATA:  Trauma.  Fall. EXAM: CT HEAD WITHOUT CONTRAST CT CERVICAL SPINE WITHOUT CONTRAST TECHNIQUE: Multidetector CT imaging of the head and cervical spine was performed following  the standard protocol without intravenous contrast. Multiplanar CT image reconstructions of the cervical spine were also generated. COMPARISON:  CT August 15, 2017. FINDINGS: CT HEAD FINDINGS Brain: No evidence of acute infarction, hemorrhage, hydrocephalus, extra-axial collection or mass lesion/mass effect. Similar patchy white matter hypoattenuation, most consistent with chronic microvascular ischemic change. Similar generalized atrophy with ex vacuo ventricular dilation. Vascular: Calcific atherosclerosis. Skull: No acute fracture.  Frontal scalp contusion Sinuses/Orbits: Mild scattered paranasal sinus mucosal thickening without air-fluid levels. No evidence of acute orbital abnormality. Other: No mastoid effusions. CT CERVICAL SPINE FINDINGS Alignment: Normal. Skull base and vertebrae: No acute fracture. No new vertebral body height loss. No primary bone lesion or focal pathologic process. Soft tissues and spinal canal: No prevertebral fluid or swelling. No visible canal hematoma. Disc levels: Multilevel degenerative change, including facet uncovertebral hypertrophy with possibly severe right foraminal stenosis at C4-C5. Upper chest: Negative. Other: Atherosclerotic vascular calcifications. IMPRESSION: CT head: 1. No evidence  of acute intracranial abnormality. 2. Frontal scalp contusion without acute fracture. 3. Chronic microvascular ischemic change and generalized atrophy. CT cervical spine: 1. No evidence of acute fracture or traumatic malalignment. 2. Multilevel degenerative change, including possibly severe right foraminal stenosis C4-C5. MRI of the cervical spine could further characterize if clinically indicated. Electronically Signed   By: Margaretha Sheffield MD   On: 03/30/2020 09:48   DG Chest Portable 1 View  Result Date: 03/30/2020 CLINICAL DATA:  Weakness.  Additional history provided: Fall. EXAM: PORTABLE CHEST 1 VIEW COMPARISON:  Report from prior chest radiographs 07/12/2018 (images unavailable). FINDINGS: Heart size within normal limits. Aortic atherosclerosis. No appreciable airspace consolidation or pulmonary edema. Calcified granuloma within the left lower lobe. No acute bony abnormality identified. IMPRESSION: No evidence of acute cardiopulmonary abnormality. Aortic Atherosclerosis (ICD10-I70.0). Electronically Signed   By: Kellie Simmering DO   On: 03/30/2020 10:26    ____________________________________________  PROCEDURES   Procedure(s) performed (including Critical Care):  Procedures  ____________________________________________  INITIAL IMPRESSION / MDM / [place] / ED COURSE  As part of my medical decision making, I reviewed the following data within the [place] notes reviewed and incorporated, Old chart reviewed, Notes from prior ED visits, and [place]. was evaluated in Emergency Department on 03/30/2020 for the symptoms described in the history of present illness. He was evaluated in the context of the global COVID-19 pandemic, which necessitated consideration that the patient might be at risk for infection with the SARS-CoV-2 virus that causes COVID-19. Institutional protocols and algorithms that  pertain to the evaluation of patients at risk for COVID-19 are in a state of rapid change based on information released by regulatory bodies including the CDC and federal and state organizations. These policies and algorithms were followed during the patient's care in the ED.  Some ED evaluations and interventions may be delayed as a result of limited staffing during the pandemic.*     Medical Decision Making:  86 yo M here with fall, weakness. Discussed with daughter via telephone, and per report pt has had multiple falls recently and is currently working with PT. Clinically, he appears well and at his mental baseline. Small contusion noted but CT Head/C-Spine negative, no signs of traumatic injury. No hip pain and he is ambulatory in ED. Given his recurrent falls, screening labs obtained which overall are unremarkable. No leukocytosis. UA without signs of UTI. Trop unremarkable, EKG non ischemic. Tele shows no  arrhythmia. He does appear slightly dehydrated and BUN/Cr is slightly elevated.  Suspect pt may have component of mild dehydration 2/2 poor PO intake as well as his HCTZ use. This could be contributing partially to his weakness/falls, which seem to relate to a component of orthostasis when he is standing. Will give fluids, have him hold his HCTZ and f/u with PCP. Otherwise, no apparent emergent medical condition. Daughter updated and in agreement. ___________________________________________  FINAL CLINICAL IMPRESSION(S) / ED DIAGNOSES  Final diagnoses:  Dehydration  Fall, initial encounter     MEDICATIONS GIVEN DURING THIS VISIT:  Medications  sodium chloride 0.9 % bolus 1,000 mL (0 mLs Intravenous Stopped 03/30/20 1330)  losartan (COZAAR) tablet 50 mg (50 mg Oral Given 03/30/20 1231)     ED Discharge Orders    None       Note:  This document was prepared using Dragon voice recognition software and may include unintentional dictation errors.   Duffy Bruce, MD 03/30/20  2036

## 2020-03-30 NOTE — ED Notes (Addendum)
This RN and Sam RN visualized a small hematoma to the central forehead upon pt arrival.

## 2020-03-30 NOTE — ED Notes (Signed)
Sam RN called [facility] and per Network engineer, they will call transportation to pick him up at this time. Pt daughter Evan Cole called and informed of pt d/c at this time. Daughter Evan Cole states that [facility] will pick up patient from ED.   Pt moved to recliner in hallway by nurses station while awaiting transportation at this time.

## 2020-03-30 NOTE — ED Notes (Signed)
Pt verbalized understanding of d/c instructions at this time. Pt denies further questions at this time. Pt awaiting transportation from [facility] at this time

## 2020-03-30 NOTE — ED Notes (Signed)
Pt had accidnet while trying to use urinal. EDT Mickel Baas and EDT Mel at bedside cleaning pt up. Pt cleaned up and clean linens placed. Pt given lunch tray and eating at this time.

## 2020-03-30 NOTE — Discharge Instructions (Addendum)
For now:  I'd recommend STOPPING the hydrochlorothiazide. This medication can contribute to dehydration and low blood pressure on standing.  Drink AT LEAST 6 glasses of water daily  Follow-up with your Primary in the next several days for blood pressure check and discussion of alternative medications

## 2020-03-30 NOTE — ED Notes (Addendum)
X-ray at bedside

## 2020-03-30 NOTE — ED Notes (Signed)
Pt given urinal.

## 2020-03-30 NOTE — ED Triage Notes (Addendum)
Pt reports he was attempting to tuck in his shirt and had to let go of his walker and he lost balance and fell head first. Hematoma noted to forehead. Pt currently on plavix. States that he just feels "spacey".

## 2020-03-30 NOTE — ED Notes (Signed)
EDP Isaacs at bedside at this time

## 2020-03-30 NOTE — ED Notes (Signed)
Pt taken out to car and assisted into [facility]

## 2020-04-02 DIAGNOSIS — Z8673 Personal history of transient ischemic attack (TIA), and cerebral infarction without residual deficits: Secondary | ICD-10-CM

## 2020-04-02 DIAGNOSIS — N1832 Chronic kidney disease, stage 3b: Secondary | ICD-10-CM

## 2020-04-02 DIAGNOSIS — I10 Essential (primary) hypertension: Secondary | ICD-10-CM

## 2020-04-02 DIAGNOSIS — S060X0A Concussion without loss of consciousness, initial encounter: Secondary | ICD-10-CM

## 2020-04-02 DIAGNOSIS — J439 Emphysema, unspecified: Secondary | ICD-10-CM

## 2020-04-02 DIAGNOSIS — F3341 Major depressive disorder, recurrent, in partial remission: Secondary | ICD-10-CM

## 2020-04-20 DIAGNOSIS — F4320 Adjustment disorder, unspecified: Secondary | ICD-10-CM

## 2020-05-04 DIAGNOSIS — F3341 Major depressive disorder, recurrent, in partial remission: Secondary | ICD-10-CM

## 2020-05-04 DIAGNOSIS — N1832 Chronic kidney disease, stage 3b: Secondary | ICD-10-CM

## 2020-05-04 DIAGNOSIS — Z8673 Personal history of transient ischemic attack (TIA), and cerebral infarction without residual deficits: Secondary | ICD-10-CM

## 2020-05-04 DIAGNOSIS — J449 Chronic obstructive pulmonary disease, unspecified: Secondary | ICD-10-CM

## 2020-05-04 DIAGNOSIS — G2581 Restless legs syndrome: Secondary | ICD-10-CM

## 2020-05-04 DIAGNOSIS — N3941 Urge incontinence: Secondary | ICD-10-CM

## 2020-05-04 DIAGNOSIS — I10 Essential (primary) hypertension: Secondary | ICD-10-CM

## 2020-06-05 DIAGNOSIS — K219 Gastro-esophageal reflux disease without esophagitis: Secondary | ICD-10-CM | POA: Diagnosis not present

## 2020-06-05 DIAGNOSIS — N183 Chronic kidney disease, stage 3 unspecified: Secondary | ICD-10-CM | POA: Diagnosis not present

## 2020-06-05 DIAGNOSIS — F329 Major depressive disorder, single episode, unspecified: Secondary | ICD-10-CM

## 2020-06-05 DIAGNOSIS — G2581 Restless legs syndrome: Secondary | ICD-10-CM

## 2020-06-05 DIAGNOSIS — J441 Chronic obstructive pulmonary disease with (acute) exacerbation: Secondary | ICD-10-CM

## 2020-06-05 DIAGNOSIS — Z8673 Personal history of transient ischemic attack (TIA), and cerebral infarction without residual deficits: Secondary | ICD-10-CM | POA: Diagnosis not present

## 2020-06-05 DIAGNOSIS — I10 Essential (primary) hypertension: Secondary | ICD-10-CM | POA: Diagnosis not present

## 2020-06-05 DIAGNOSIS — N3941 Urge incontinence: Secondary | ICD-10-CM

## 2020-06-23 DIAGNOSIS — K1120 Sialoadenitis, unspecified: Secondary | ICD-10-CM | POA: Diagnosis not present

## 2020-06-30 DIAGNOSIS — F3341 Major depressive disorder, recurrent, in partial remission: Secondary | ICD-10-CM | POA: Diagnosis not present

## 2020-06-30 DIAGNOSIS — J449 Chronic obstructive pulmonary disease, unspecified: Secondary | ICD-10-CM | POA: Diagnosis not present

## 2020-06-30 DIAGNOSIS — N1832 Chronic kidney disease, stage 3b: Secondary | ICD-10-CM

## 2020-06-30 DIAGNOSIS — I10 Essential (primary) hypertension: Secondary | ICD-10-CM | POA: Diagnosis not present

## 2020-06-30 DIAGNOSIS — Z8673 Personal history of transient ischemic attack (TIA), and cerebral infarction without residual deficits: Secondary | ICD-10-CM | POA: Diagnosis not present

## 2020-08-28 DIAGNOSIS — J449 Chronic obstructive pulmonary disease, unspecified: Secondary | ICD-10-CM

## 2020-08-28 DIAGNOSIS — F331 Major depressive disorder, recurrent, moderate: Secondary | ICD-10-CM

## 2020-08-28 DIAGNOSIS — G2581 Restless legs syndrome: Secondary | ICD-10-CM

## 2020-08-28 DIAGNOSIS — Z8673 Personal history of transient ischemic attack (TIA), and cerebral infarction without residual deficits: Secondary | ICD-10-CM | POA: Diagnosis not present

## 2020-08-28 DIAGNOSIS — K219 Gastro-esophageal reflux disease without esophagitis: Secondary | ICD-10-CM | POA: Diagnosis not present

## 2020-08-28 DIAGNOSIS — N3941 Urge incontinence: Secondary | ICD-10-CM

## 2020-08-28 DIAGNOSIS — N183 Chronic kidney disease, stage 3 unspecified: Secondary | ICD-10-CM | POA: Diagnosis not present

## 2020-08-28 DIAGNOSIS — I10 Essential (primary) hypertension: Secondary | ICD-10-CM | POA: Diagnosis not present

## 2020-10-22 DIAGNOSIS — H9202 Otalgia, left ear: Secondary | ICD-10-CM | POA: Diagnosis not present

## 2020-11-05 DIAGNOSIS — F3341 Major depressive disorder, recurrent, in partial remission: Secondary | ICD-10-CM

## 2020-11-05 DIAGNOSIS — N1832 Chronic kidney disease, stage 3b: Secondary | ICD-10-CM | POA: Diagnosis not present

## 2020-11-05 DIAGNOSIS — I69398 Other sequelae of cerebral infarction: Secondary | ICD-10-CM | POA: Diagnosis not present

## 2020-11-05 DIAGNOSIS — J449 Chronic obstructive pulmonary disease, unspecified: Secondary | ICD-10-CM | POA: Diagnosis not present

## 2020-11-05 DIAGNOSIS — I10 Essential (primary) hypertension: Secondary | ICD-10-CM | POA: Diagnosis not present

## 2020-12-04 DIAGNOSIS — L03012 Cellulitis of left finger: Secondary | ICD-10-CM | POA: Diagnosis not present

## 2020-12-04 DIAGNOSIS — S60152A Contusion of left little finger with damage to nail, initial encounter: Secondary | ICD-10-CM | POA: Diagnosis not present

## 2020-12-11 DIAGNOSIS — R221 Localized swelling, mass and lump, neck: Secondary | ICD-10-CM

## 2020-12-11 DIAGNOSIS — M545 Low back pain, unspecified: Secondary | ICD-10-CM

## 2020-12-18 DIAGNOSIS — R590 Localized enlarged lymph nodes: Secondary | ICD-10-CM | POA: Diagnosis not present

## 2020-12-24 DIAGNOSIS — H6123 Impacted cerumen, bilateral: Secondary | ICD-10-CM | POA: Diagnosis not present

## 2020-12-25 DIAGNOSIS — I10 Essential (primary) hypertension: Secondary | ICD-10-CM | POA: Diagnosis not present

## 2020-12-25 DIAGNOSIS — J449 Chronic obstructive pulmonary disease, unspecified: Secondary | ICD-10-CM | POA: Diagnosis not present

## 2020-12-25 DIAGNOSIS — I69359 Hemiplegia and hemiparesis following cerebral infarction affecting unspecified side: Secondary | ICD-10-CM | POA: Diagnosis not present

## 2020-12-25 DIAGNOSIS — N3941 Urge incontinence: Secondary | ICD-10-CM

## 2020-12-25 DIAGNOSIS — N183 Chronic kidney disease, stage 3 unspecified: Secondary | ICD-10-CM | POA: Diagnosis not present

## 2020-12-25 DIAGNOSIS — G2581 Restless legs syndrome: Secondary | ICD-10-CM

## 2020-12-25 DIAGNOSIS — K219 Gastro-esophageal reflux disease without esophagitis: Secondary | ICD-10-CM

## 2020-12-30 DIAGNOSIS — H6120 Impacted cerumen, unspecified ear: Secondary | ICD-10-CM | POA: Diagnosis not present

## 2021-03-19 DIAGNOSIS — M545 Low back pain, unspecified: Secondary | ICD-10-CM

## 2021-04-26 DIAGNOSIS — M62838 Other muscle spasm: Secondary | ICD-10-CM | POA: Diagnosis not present

## 2021-04-29 DIAGNOSIS — S91001A Unspecified open wound, right ankle, initial encounter: Secondary | ICD-10-CM | POA: Diagnosis not present

## 2021-04-30 DIAGNOSIS — N401 Enlarged prostate with lower urinary tract symptoms: Secondary | ICD-10-CM

## 2021-04-30 DIAGNOSIS — F0150 Vascular dementia without behavioral disturbance: Secondary | ICD-10-CM

## 2021-04-30 DIAGNOSIS — N183 Chronic kidney disease, stage 3 unspecified: Secondary | ICD-10-CM

## 2021-04-30 DIAGNOSIS — Z8673 Personal history of transient ischemic attack (TIA), and cerebral infarction without residual deficits: Secondary | ICD-10-CM

## 2021-04-30 DIAGNOSIS — G2581 Restless legs syndrome: Secondary | ICD-10-CM

## 2021-04-30 DIAGNOSIS — K219 Gastro-esophageal reflux disease without esophagitis: Secondary | ICD-10-CM

## 2021-04-30 DIAGNOSIS — J449 Chronic obstructive pulmonary disease, unspecified: Secondary | ICD-10-CM

## 2021-04-30 DIAGNOSIS — I10 Essential (primary) hypertension: Secondary | ICD-10-CM

## 2021-04-30 DIAGNOSIS — F331 Major depressive disorder, recurrent, moderate: Secondary | ICD-10-CM

## 2021-05-03 DIAGNOSIS — F339 Major depressive disorder, recurrent, unspecified: Secondary | ICD-10-CM | POA: Diagnosis not present

## 2021-07-01 DIAGNOSIS — F0150 Vascular dementia without behavioral disturbance: Secondary | ICD-10-CM | POA: Diagnosis not present

## 2021-07-01 DIAGNOSIS — J449 Chronic obstructive pulmonary disease, unspecified: Secondary | ICD-10-CM | POA: Diagnosis not present

## 2021-07-01 DIAGNOSIS — F3341 Major depressive disorder, recurrent, in partial remission: Secondary | ICD-10-CM | POA: Diagnosis not present

## 2021-07-01 DIAGNOSIS — I10 Essential (primary) hypertension: Secondary | ICD-10-CM | POA: Diagnosis not present

## 2021-07-01 DIAGNOSIS — K219 Gastro-esophageal reflux disease without esophagitis: Secondary | ICD-10-CM

## 2021-09-01 DIAGNOSIS — Z8673 Personal history of transient ischemic attack (TIA), and cerebral infarction without residual deficits: Secondary | ICD-10-CM

## 2021-09-01 DIAGNOSIS — G2581 Restless legs syndrome: Secondary | ICD-10-CM

## 2021-09-01 DIAGNOSIS — F331 Major depressive disorder, recurrent, moderate: Secondary | ICD-10-CM

## 2021-09-01 DIAGNOSIS — N3941 Urge incontinence: Secondary | ICD-10-CM

## 2021-09-01 DIAGNOSIS — K219 Gastro-esophageal reflux disease without esophagitis: Secondary | ICD-10-CM | POA: Diagnosis not present

## 2021-09-01 DIAGNOSIS — F0150 Vascular dementia without behavioral disturbance: Secondary | ICD-10-CM | POA: Diagnosis not present

## 2021-09-01 DIAGNOSIS — I10 Essential (primary) hypertension: Secondary | ICD-10-CM | POA: Diagnosis not present

## 2021-09-01 DIAGNOSIS — J449 Chronic obstructive pulmonary disease, unspecified: Secondary | ICD-10-CM

## 2021-09-01 DIAGNOSIS — N183 Chronic kidney disease, stage 3 unspecified: Secondary | ICD-10-CM | POA: Diagnosis not present

## 2021-10-15 LAB — CBC AND DIFFERENTIAL
HCT: 34 — AB (ref 41–53)
Hemoglobin: 11.1 — AB (ref 13.5–17.5)
Neutrophils Absolute: 3605
Platelets: 252 10*3/uL (ref 150–400)
WBC: 6.1

## 2021-10-15 LAB — LIPID PANEL
Cholesterol: 133 (ref 0–200)
HDL: 35 (ref 35–70)
LDL Cholesterol: 77
Triglycerides: 120 (ref 40–160)

## 2021-10-15 LAB — HEPATIC FUNCTION PANEL
ALT: 15 U/L (ref 10–40)
AST: 19 (ref 14–40)
Alkaline Phosphatase: 44 (ref 25–125)
Bilirubin, Total: 0.5

## 2021-10-15 LAB — BASIC METABOLIC PANEL
BUN: 45 — AB (ref 4–21)
CO2: 26 — AB (ref 13–22)
CO2: 26 — AB (ref 13–22)
Chloride: 106 (ref 99–108)
Chloride: 106 (ref 99–108)
Creatinine: 1.9 — AB (ref 0.6–1.3)
Glucose: 77
Potassium: 4.4 mEq/L (ref 3.5–5.1)
Potassium: 4.4 mEq/L (ref 3.5–5.1)
Sodium: 137 (ref 137–147)

## 2021-10-15 LAB — COMPREHENSIVE METABOLIC PANEL
Albumin: 3.4 — AB (ref 3.5–5.0)
Calcium: 8.9 (ref 8.7–10.7)
Globulin: 2
eGFR: 31

## 2021-10-15 LAB — CBC: RBC: 3.66 — AB (ref 3.87–5.11)

## 2021-11-04 DIAGNOSIS — F0150 Vascular dementia without behavioral disturbance: Secondary | ICD-10-CM

## 2021-11-04 DIAGNOSIS — J449 Chronic obstructive pulmonary disease, unspecified: Secondary | ICD-10-CM

## 2021-11-04 DIAGNOSIS — N3941 Urge incontinence: Secondary | ICD-10-CM

## 2021-11-04 DIAGNOSIS — F3340 Major depressive disorder, recurrent, in remission, unspecified: Secondary | ICD-10-CM

## 2021-11-04 DIAGNOSIS — I10 Essential (primary) hypertension: Secondary | ICD-10-CM

## 2021-11-29 DIAGNOSIS — S01552A Open bite of oral cavity, initial encounter: Secondary | ICD-10-CM | POA: Diagnosis not present

## 2021-12-15 DIAGNOSIS — S90511A Abrasion, right ankle, initial encounter: Secondary | ICD-10-CM | POA: Diagnosis not present

## 2021-12-29 DIAGNOSIS — K219 Gastro-esophageal reflux disease without esophagitis: Secondary | ICD-10-CM

## 2021-12-29 DIAGNOSIS — F329 Major depressive disorder, single episode, unspecified: Secondary | ICD-10-CM

## 2021-12-29 DIAGNOSIS — Z8673 Personal history of transient ischemic attack (TIA), and cerebral infarction without residual deficits: Secondary | ICD-10-CM | POA: Diagnosis not present

## 2021-12-29 DIAGNOSIS — F0150 Vascular dementia without behavioral disturbance: Secondary | ICD-10-CM | POA: Diagnosis not present

## 2021-12-29 DIAGNOSIS — N183 Chronic kidney disease, stage 3 unspecified: Secondary | ICD-10-CM | POA: Diagnosis not present

## 2021-12-29 DIAGNOSIS — G2581 Restless legs syndrome: Secondary | ICD-10-CM

## 2021-12-29 DIAGNOSIS — I10 Essential (primary) hypertension: Secondary | ICD-10-CM | POA: Diagnosis not present

## 2021-12-29 DIAGNOSIS — N3941 Urge incontinence: Secondary | ICD-10-CM

## 2021-12-30 ENCOUNTER — Encounter: Payer: Self-pay | Admitting: Internal Medicine

## 2021-12-30 ENCOUNTER — Observation Stay: Payer: Medicare Other

## 2021-12-30 ENCOUNTER — Emergency Department: Payer: Medicare Other

## 2021-12-30 ENCOUNTER — Observation Stay
Admission: EM | Admit: 2021-12-30 | Discharge: 2021-12-31 | Disposition: A | Discharge status: Skilled Nursing Facility | Payer: Medicare Other | Attending: Family Medicine | Admitting: Family Medicine

## 2021-12-30 ENCOUNTER — Other Ambulatory Visit: Payer: Self-pay

## 2021-12-30 DIAGNOSIS — E785 Hyperlipidemia, unspecified: Secondary | ICD-10-CM | POA: Diagnosis present

## 2021-12-30 DIAGNOSIS — F17290 Nicotine dependence, other tobacco product, uncomplicated: Secondary | ICD-10-CM | POA: Diagnosis not present

## 2021-12-30 DIAGNOSIS — F329 Major depressive disorder, single episode, unspecified: Secondary | ICD-10-CM | POA: Diagnosis present

## 2021-12-30 DIAGNOSIS — Z7982 Long term (current) use of aspirin: Secondary | ICD-10-CM | POA: Diagnosis not present

## 2021-12-30 DIAGNOSIS — L03116 Cellulitis of left lower limb: Secondary | ICD-10-CM | POA: Diagnosis not present

## 2021-12-30 DIAGNOSIS — E46 Unspecified protein-calorie malnutrition: Secondary | ICD-10-CM | POA: Insufficient documentation

## 2021-12-30 DIAGNOSIS — R531 Weakness: Secondary | ICD-10-CM | POA: Diagnosis present

## 2021-12-30 DIAGNOSIS — F0390 Unspecified dementia without behavioral disturbance: Secondary | ICD-10-CM | POA: Diagnosis not present

## 2021-12-30 DIAGNOSIS — F418 Other specified anxiety disorders: Secondary | ICD-10-CM | POA: Diagnosis present

## 2021-12-30 DIAGNOSIS — Z8546 Personal history of malignant neoplasm of prostate: Secondary | ICD-10-CM | POA: Diagnosis not present

## 2021-12-30 DIAGNOSIS — L89510 Pressure ulcer of right ankle, unstageable: Secondary | ICD-10-CM | POA: Diagnosis not present

## 2021-12-30 DIAGNOSIS — Z66 Do not resuscitate: Secondary | ICD-10-CM | POA: Diagnosis not present

## 2021-12-30 DIAGNOSIS — D72829 Elevated white blood cell count, unspecified: Secondary | ICD-10-CM | POA: Diagnosis not present

## 2021-12-30 DIAGNOSIS — Z8673 Personal history of transient ischemic attack (TIA), and cerebral infarction without residual deficits: Secondary | ICD-10-CM | POA: Diagnosis not present

## 2021-12-30 DIAGNOSIS — L8990 Pressure ulcer of unspecified site, unspecified stage: Secondary | ICD-10-CM | POA: Insufficient documentation

## 2021-12-30 DIAGNOSIS — G459 Transient cerebral ischemic attack, unspecified: Secondary | ICD-10-CM | POA: Diagnosis not present

## 2021-12-30 DIAGNOSIS — N1832 Chronic kidney disease, stage 3b: Secondary | ICD-10-CM | POA: Diagnosis not present

## 2021-12-30 DIAGNOSIS — I10 Essential (primary) hypertension: Secondary | ICD-10-CM | POA: Diagnosis not present

## 2021-12-30 DIAGNOSIS — I129 Hypertensive chronic kidney disease with stage 1 through stage 4 chronic kidney disease, or unspecified chronic kidney disease: Secondary | ICD-10-CM | POA: Insufficient documentation

## 2021-12-30 DIAGNOSIS — Z79899 Other long term (current) drug therapy: Secondary | ICD-10-CM | POA: Insufficient documentation

## 2021-12-30 DIAGNOSIS — N183 Chronic kidney disease, stage 3 unspecified: Secondary | ICD-10-CM

## 2021-12-30 LAB — COMPREHENSIVE METABOLIC PANEL
ALT: 24 U/L (ref 0–44)
AST: 28 U/L (ref 15–41)
Albumin: 3 g/dL — ABNORMAL LOW (ref 3.5–5.0)
Alkaline Phosphatase: 60 U/L (ref 38–126)
Anion gap: 9 (ref 5–15)
BUN: 36 mg/dL — ABNORMAL HIGH (ref 8–23)
CO2: 23 mmol/L (ref 22–32)
Calcium: 8.7 mg/dL — ABNORMAL LOW (ref 8.9–10.3)
Chloride: 103 mmol/L (ref 98–111)
Creatinine, Ser: 1.8 mg/dL — ABNORMAL HIGH (ref 0.61–1.24)
GFR, Estimated: 33 mL/min — ABNORMAL LOW (ref >60)
Glucose, Bld: 103 mg/dL — ABNORMAL HIGH (ref 70–99)
Potassium: 4.1 mmol/L (ref 3.5–5.1)
Sodium: 135 mmol/L (ref 135–145)
Total Bilirubin: 0.6 mg/dL (ref 0.3–1.2)
Total Protein: 6.6 g/dL (ref 6.5–8.1)

## 2021-12-30 LAB — URINE DRUG SCREEN, QUALITATIVE (ARMC ONLY)
Amphetamines, Ur Screen: NOT DETECTED
Barbiturates, Ur Screen: NOT DETECTED
Benzodiazepine, Ur Scrn: NOT DETECTED
Cannabinoid 50 Ng, Ur ~~LOC~~: NOT DETECTED
Cocaine Metabolite,Ur ~~LOC~~: NOT DETECTED
MDMA (Ecstasy)Ur Screen: NOT DETECTED
Methadone Scn, Ur: NOT DETECTED
Opiate, Ur Screen: NOT DETECTED
Phencyclidine (PCP) Ur S: NOT DETECTED
Tricyclic, Ur Screen: NOT DETECTED

## 2021-12-30 LAB — MRSA NEXT GEN BY PCR, NASAL: MRSA by PCR Next Gen: NOT DETECTED

## 2021-12-30 LAB — DIFFERENTIAL
Abs Immature Granulocytes: 0.37 10*3/uL — ABNORMAL HIGH (ref 0.00–0.07)
Basophils Absolute: 0.1 10*3/uL (ref 0.0–0.1)
Basophils Relative: 1 %
Eosinophils Absolute: 0.6 10*3/uL — ABNORMAL HIGH (ref 0.0–0.5)
Eosinophils Relative: 4 %
Immature Granulocytes: 2 %
Lymphocytes Relative: 12 %
Lymphs Abs: 1.8 10*3/uL (ref 0.7–4.0)
Monocytes Absolute: 1.3 10*3/uL — ABNORMAL HIGH (ref 0.1–1.0)
Monocytes Relative: 9 %
Neutro Abs: 11.2 10*3/uL — ABNORMAL HIGH (ref 1.7–7.7)
Neutrophils Relative %: 72 %

## 2021-12-30 LAB — PROTIME-INR
INR: 1.1 (ref 0.8–1.2)
Prothrombin Time: 13.9 seconds (ref 11.4–15.2)

## 2021-12-30 LAB — CBC
HCT: 33 % — ABNORMAL LOW (ref 39.0–52.0)
Hemoglobin: 10.4 g/dL — ABNORMAL LOW (ref 13.0–17.0)
MCH: 28.7 pg (ref 26.0–34.0)
MCHC: 31.5 g/dL (ref 30.0–36.0)
MCV: 90.9 fL (ref 80.0–100.0)
Platelets: 521 10*3/uL — ABNORMAL HIGH (ref 150–400)
RBC: 3.63 MIL/uL — ABNORMAL LOW (ref 4.22–5.81)
RDW: 13.4 % (ref 11.5–15.5)
WBC: 15.4 10*3/uL — ABNORMAL HIGH (ref 4.0–10.5)
nRBC: 0 % (ref 0.0–0.2)

## 2021-12-30 LAB — TROPONIN I (HIGH SENSITIVITY): Troponin I (High Sensitivity): 13 ng/L (ref <18)

## 2021-12-30 LAB — APTT: aPTT: 37 seconds — ABNORMAL HIGH (ref 24–36)

## 2021-12-30 LAB — CBG MONITORING, ED: Glucose-Capillary: 96 mg/dL (ref 70–99)

## 2021-12-30 LAB — ETHANOL: Alcohol, Ethyl (B): <10 mg/dL (ref <10)

## 2021-12-30 LAB — I-STAT CREATININE, ED: Creatinine, Ser: 2.1 mg/dL — ABNORMAL HIGH (ref 0.61–1.24)

## 2021-12-30 MED ORDER — ACETAMINOPHEN 160 MG/5ML PO SOLN: 650.0000 mg | ORAL | Status: DC | PRN

## 2021-12-30 MED ORDER — ROPINIROLE HCL 1 MG PO TABS
0.5000 mg | ORAL_TABLET | Freq: Every day | ORAL | Status: DC
Start: 2021-12-30 — End: 2022-01-01
  Administered 2021-12-30 – 2021-12-31 (×2): 0.5 mg via ORAL
  Filled 2021-12-30 (×2): qty 1

## 2021-12-30 MED ORDER — SERTRALINE HCL 50 MG PO TABS
150.0000 mg | ORAL_TABLET | Freq: Every day | ORAL | Status: DC
  Administered 2021-12-31: 150 mg via ORAL
  Filled 2021-12-30: qty 3

## 2021-12-30 MED ORDER — SODIUM CHLORIDE 0.9 % IV SOLN
1.0000 g | INTRAVENOUS | Status: DC
  Filled 2021-12-30: qty 10

## 2021-12-30 MED ORDER — LOSARTAN POTASSIUM 50 MG PO TABS
50.0000 mg | ORAL_TABLET | Freq: Every day | ORAL | Status: DC
Start: 2021-12-30 — End: 2021-12-30

## 2021-12-30 MED ORDER — HYDRALAZINE HCL 10 MG PO TABS: 10.0000 mg | ORAL_TABLET | Freq: Four times a day (QID) | ORAL | Status: DC | PRN

## 2021-12-30 MED ORDER — ASPIRIN 81 MG PO TBEC
81.0000 mg | DELAYED_RELEASE_TABLET | Freq: Every day | ORAL | Status: DC
  Administered 2021-12-31: 81 mg via ORAL
  Filled 2021-12-30: qty 1

## 2021-12-30 MED ORDER — HYDRALAZINE HCL 20 MG/ML IJ SOLN: 5.0000 mg | Freq: Four times a day (QID) | INTRAMUSCULAR | Status: DC | PRN

## 2021-12-30 MED ORDER — TAMSULOSIN HCL 0.4 MG PO CAPS
0.4000 mg | ORAL_CAPSULE | Freq: Every day | ORAL | Status: DC
  Administered 2021-12-31: 0.4 mg via ORAL
  Filled 2021-12-30: qty 1

## 2021-12-30 MED ORDER — MIRABEGRON ER 50 MG PO TB24
50.0000 mg | ORAL_TABLET | Freq: Every day | ORAL | Status: DC
  Administered 2021-12-31: 50 mg via ORAL
  Filled 2021-12-30: qty 1

## 2021-12-30 MED ORDER — POLYETHYLENE GLYCOL 3350 17 G PO PACK: 17.0000 g | PACK | Freq: Every day | ORAL | Status: DC | PRN

## 2021-12-30 MED ORDER — SENNOSIDES-DOCUSATE SODIUM 8.6-50 MG PO TABS: 1.0000 | ORAL_TABLET | Freq: Every evening | ORAL | Status: DC | PRN

## 2021-12-30 MED ORDER — CLOPIDOGREL BISULFATE 75 MG PO TABS
75.0000 mg | ORAL_TABLET | Freq: Every day | ORAL | Status: DC
  Administered 2021-12-31: 75 mg via ORAL
  Filled 2021-12-30: qty 1

## 2021-12-30 MED ORDER — DOCUSATE SODIUM 100 MG PO CAPS
100.0000 mg | ORAL_CAPSULE | Freq: Two times a day (BID) | ORAL | Status: DC
  Administered 2021-12-30 – 2021-12-31 (×3): 100 mg via ORAL
  Filled 2021-12-30 (×3): qty 1

## 2021-12-30 MED ORDER — ENOXAPARIN SODIUM 40 MG/0.4ML IJ SOSY
40.0000 mg | PREFILLED_SYRINGE | Freq: Every day | INTRAMUSCULAR | Status: DC
  Administered 2021-12-30: 40 mg via SUBCUTANEOUS
  Filled 2021-12-30: qty 0.4

## 2021-12-30 MED ORDER — SODIUM CHLORIDE 0.9 % IV SOLN
2.0000 g | Freq: Once | INTRAVENOUS | Status: AC
  Administered 2021-12-30: 2 g via INTRAVENOUS
  Filled 2021-12-30: qty 20

## 2021-12-30 MED ORDER — BUPROPION HCL ER (XL) 150 MG PO TB24
150.0000 mg | ORAL_TABLET | Freq: Every morning | ORAL | Status: DC
  Administered 2021-12-31: 150 mg via ORAL
  Filled 2021-12-30: qty 1

## 2021-12-30 MED ORDER — ACETAMINOPHEN 650 MG RE SUPP: 650.0000 mg | RECTAL | Status: DC | PRN

## 2021-12-30 MED ORDER — ARIPIPRAZOLE 5 MG PO TABS
5.0000 mg | ORAL_TABLET | Freq: Every day | ORAL | Status: DC
  Administered 2021-12-30 – 2021-12-31 (×2): 5 mg via ORAL
  Filled 2021-12-30 (×2): qty 1

## 2021-12-30 MED ORDER — MELATONIN 5 MG PO TABS
5.0000 mg | ORAL_TABLET | Freq: Every day | ORAL | Status: DC
  Administered 2021-12-30 – 2021-12-31 (×2): 5 mg via ORAL
  Filled 2021-12-30 (×2): qty 1

## 2021-12-30 MED ORDER — STROKE: EARLY STAGES OF RECOVERY BOOK
Freq: Once | Status: AC
Start: 2021-12-30 — End: 2021-12-30

## 2021-12-30 MED ORDER — ATORVASTATIN CALCIUM 20 MG PO TABS
40.0000 mg | ORAL_TABLET | Freq: Every day | ORAL | Status: DC
  Administered 2021-12-30 – 2021-12-31 (×2): 40 mg via ORAL
  Filled 2021-12-30 (×2): qty 2

## 2021-12-30 MED ORDER — CLOPIDOGREL BISULFATE 75 MG PO TABS
300.0000 mg | ORAL_TABLET | Freq: Once | ORAL | Status: AC
  Administered 2021-12-30: 300 mg via ORAL
  Filled 2021-12-30: qty 4

## 2021-12-30 MED ORDER — SODIUM CHLORIDE 0.9% FLUSH
3.0000 mL | Freq: Once | INTRAVENOUS | Status: AC
  Administered 2021-12-30: 3 mL via INTRAVENOUS

## 2021-12-30 MED ORDER — ACETAMINOPHEN 325 MG PO TABS
650.0000 mg | ORAL_TABLET | ORAL | Status: DC | PRN
  Administered 2021-12-31: 650 mg via ORAL
  Filled 2021-12-30: qty 2

## 2021-12-30 NOTE — Assessment & Plan Note (Signed)
-   Atorvastatin 40 mg daily resumed 

## 2021-12-30 NOTE — Hospital Course (Addendum)
Mr. Kirwan is a 88 y.o. M with HTN, RLS, depression, essential tremor, hx TIA, hx pros CA who presented with acute left-sided weakness and difficulty eating.  In the ER, code stroke was called but he was outside tPA window. Cr was up from baseline and he had a leukocytosis. Neurology was consulted.

## 2021-12-30 NOTE — ED Provider Notes (Signed)
[facility] Provider Note    Event Date/Time   First MD Initiated Contact with Patient 12/30/21 1458     (approximate)   History   Code Stroke   HPI  Evan Fuster. is a 88 y.o. male  with dementia who comes in with left sided weakness.  Patient's last known normal was yesterday around 930 when he went to bed.  He states that he woke up this morning from [facility] with left-sided arm weakness.  He reports that the weakness is still there and it is more difficult for him to use his cup or spoon.  He denies any new chest pain or shortness of breath. He does report some chornic chest pain for a month or longer.     Physical Exam   Triage Vital Signs: Blood pressure (!) 176/83, pulse 70, temperature 97.9 F (36.6 C), temperature source Oral, resp. rate 16, height '5\' 10"'$  (1.778 m), weight 71 kg, SpO2 98 %.   Most recent vital signs: Vitals:   12/30/21 1520  BP: (!) 176/83  Pulse: 70  Resp: 16  Temp: 97.9 F (36.6 C)  SpO2: 98%     General: Awake, no distress.  CV:  Good peripheral perfusion.  Resp:  Normal effort.  Abd:  No distention.  Other:  Some left arm weakness.  Slight increase in leg on the left, with area of abrasion on the tib/fib area with mild erythema    ED Results / Procedures / Treatments   Labs (all labs ordered are listed, but only abnormal results are displayed) Labs Reviewed  APTT - Abnormal; Notable for the following components:      Result Value   aPTT 37 (*)    All other components within normal limits  CBC - Abnormal; Notable for the following components:   WBC 15.4 (*)    RBC 3.63 (*)    Hemoglobin 10.4 (*)    HCT 33.0 (*)    Platelets 521 (*)    All other components within normal limits  DIFFERENTIAL - Abnormal; Notable for the following components:   Neutro Abs 11.2 (*)    Monocytes Absolute 1.3 (*)    Eosinophils Absolute 0.6 (*)    Abs Immature Granulocytes 0.37 (*)    All other components within  normal limits  COMPREHENSIVE METABOLIC PANEL - Abnormal; Notable for the following components:   Glucose, Bld 103 (*)    BUN 36 (*)    Creatinine, Ser 1.80 (*)    Calcium 8.7 (*)    Albumin 3.0 (*)    GFR, Estimated 33 (*)    All other components within normal limits  I-STAT CREATININE, ED - Abnormal; Notable for the following components:   Creatinine, Ser 2.10 (*)    All other components within normal limits  PROTIME-INR  ETHANOL  CBG MONITORING, ED  CBG MONITORING, ED     EKG  My interpretation of EKG:  Sinus no st elevation, no twi normal intervals   RADIOLOGY I have reviewed the ct head personally and interpretted and no ICH    PROCEDURES:  Critical Care performed: Yes, see critical care procedure note(s)  .1-3 Lead EKG Interpretation  Performed by: Vanessa [place], MD Authorized by: Vanessa [place], MD     Interpretation: normal     ECG rate:  70   ECG rate assessment: normal     Rhythm: sinus rhythm     Ectopy: none     Conduction:  normal   .Critical Care  Performed by: Vanessa [place], MD Authorized by: Vanessa [place], MD   Critical care provider statement:    Critical care time (minutes):  30   Critical care was necessary to treat or prevent imminent or life-threatening deterioration of the following conditions:  CNS failure or compromise   Critical care was time spent personally by me on the following activities:  Development of treatment plan with patient or surrogate, discussions with consultants, evaluation of patient's response to treatment, examination of patient, ordering and review of laboratory studies, ordering and review of radiographic studies, ordering and performing treatments and interventions, pulse oximetry, re-evaluation of patient's condition and review of old charts    [place] ED: Medications - No data to display   IMPRESSION / MDM / [place] / ED COURSE  I reviewed the triage vital signs and the nursing  notes.   Patient's presentation is most consistent with acute presentation with potential threat to life or bodily function.   Differential stroke, TIA, bleed-- Looks like cellulitis on the left leg from abrasion white count elevated no other sepsis criteira. Will get DVT US to rule out blood clot.  Troponin is at negative.  Ethanol negative.  CMP shows stable creatinine.  CBC shows slightly elevated white count.  Discussed with neurology who recommends admission for stroke workup.  I did call patient's POA and they were comfortable with admission.  Discussed hospital team for admission   The patient is on the cardiac monitor to evaluate for evidence of arrhythmia and/or significant heart rate changes.      FINAL CLINICAL IMPRESSION(S) / ED DIAGNOSES   Final diagnoses:  TIA (transient ischemic attack)  Cellulitis of left lower extremity     Rx / DC Orders   ED Discharge Orders     None        Note:  This document was prepared using Dragon voice recognition software and may include unintentional dictation errors.   Vanessa , MD 12/30/21 [phone]

## 2021-12-30 NOTE — Progress Notes (Addendum)
**  Pt stated that his daughter is in [place] and is his HCPOA.   12/30/21 1500  Clinical Encounter Type  Visited With Patient  Visit Type Initial;Code   Chaplain Gianny Sabino responded to code stroke; checked-in on pt's well being following CT.  Pt calm, stated that he does find meaning in his faith (parents were missionaries). Chaplain B offered non-anxious, compassionate presence and a brief prayer & words of encouragement.  Pt stated that his daughter is in [place] and is his HCPOA.

## 2021-12-30 NOTE — Assessment & Plan Note (Signed)
-   Losartan 50 mg nightly resumed - Hydralazine 10 mg p.o. every 6 hours as needed for SBP greater than 180, 4 days ordered

## 2021-12-30 NOTE — ED Triage Notes (Signed)
First Nurse: Pt here from [facility] via [place] with left sided weakness. Pt starting having a hard time eating around 12p, ems states pt is able to move both arms at this time with equal grip strength. Pt has a hx of dementia. Pt is a 1-2 assist.   148-cbg 97.8-oral 124/58 98% RA 68

## 2021-12-30 NOTE — Consult Note (Signed)
Neurology Consultation Reason for Consult: Concern for stroke Referring Physician: Jari Pigg, M  CC: Concern for stroke  History is obtained from: Patient  HPI: Evan Cole. is a 88 y.o. male with a history of hypertension who presents with left-sided weakness that the patient states is present on awakening this morning.  Staff noticed it this afternoon when he was trying to eat and was unable to use his hand as well as he normally is.  Due to this he was brought into the emergency department, and though he was outside the 4.5-hour window given that he was complaining of some visual blurriness it was felt that he was Lucianne Lei positive and therefore a code stroke was activated.  He was brought emergently to CT where a CT was performed.  An i-STAT creatinine revealed an elevated creatinine and since he had no findings consistent with an LVO vascular imaging was deferred for the time being.   LKW: 8/9 prior to bed tpa given?: no, outside of window  Past Medical History:  Diagnosis Date   Frequent falls    Hypertension    Shoulder dislocation    TIA (transient ischemic attack)      No family history on file.   Social History:  reports that he has been smoking pipe. He uses smokeless tobacco. He reports current alcohol use. He reports that he does not use drugs.   Exam: Current vital signs: BP (!) 176/83   Pulse 70   Temp 97.9 F (36.6 C) (Oral)   Resp 16   Ht '5\' 10"'$  (1.778 m)   Wt 71 kg   SpO2 98%   BMI 22.46 kg/m  Vital signs in last 24 hours: Temp:  [97.9 F (36.6 C)] 97.9 F (36.6 C) (08/10 1520) Pulse Rate:  [70] 70 (08/10 1520) Resp:  [16] 16 (08/10 1520) BP: (176)/(83) 176/83 (08/10 1520) SpO2:  [98 %] 98 % (08/10 1520) Weight:  [71 kg] 71 kg (08/10 1520)   Physical Exam  Constitutional: Appears well-developed and well-nourished.  Psych: Affect appropriate to situation Eyes: No scleral injection HENT: No OP obstruction MSK: no joint deformities.   Cardiovascular: Normal rate and regular rhythm.  Respiratory: Effort normal, non-labored breathing GI: Soft.  No distension. There is no tenderness.  Skin: WDI  Neuro: Mental Status: Patient is awake, alert, oriented to person and age, but not month Patient is able to give a clear and coherent history. No signs of aphasia or neglect Cranial Nerves: II: Visual Fields are full.  III,IV, VI: EOMI without ptosis or diploplia.  V: Facial sensation is diminished on the left face VII: Facial movement with mild left facial weakness VIII: hearing is intact to voice X: Uvula elevates symmetrically XI: Shoulder shrug is symmetric. XII: tongue is midline without atrophy or fasciculations.  Motor: Tone is normal. Bulk is normal. 5/5 strength was present in the right arm and leg as well as left leg, he has 4/5 weakness of the distal left arm but no drift. Sensory: Sensation is symmetric to light touch and temperature in the arms and legs. Cerebellar: No clear ataxia on finger-nose-finger    I have reviewed labs in epic and the results pertinent to this consultation are: Creatinine 2.1  I have reviewed the images obtained:CT head-no acute findings  Impression: 88 year old male with new left facial weakness and left hand weakness consistent with a subacute stroke.  It has been present since awakening per the patient, and therefore he is outside the window for  any type of treatment.  He will need to be admitted for therapy and risk factor modification.  Recommendations: - HgbA1c, fasting lipid panel - MRI of the brain without contrast - Frequent neuro checks - Echocardiogram - MRA head and neck - Prophylactic therapy-Antiplatelet med: Aspirin - dose '81mg'$  and plavix '75mg'$  daily  after '300mg'$  load  - Risk factor modification - Telemetry monitoring - PT consult, OT consult, Speech consult    Roland Rack, MD [facility] [phone]  If 7pm- 7am, please page  neurology on call as listed in AMION.

## 2021-12-30 NOTE — Assessment & Plan Note (Signed)
-   Abilify 5 mg daily, bupropion 150 mg daily, sertraline 150 mg daily resumed

## 2021-12-30 NOTE — ED Notes (Signed)
Called [facility] to initiate Code Stroke @ [phone] spoke to Foster who will activate page

## 2021-12-30 NOTE — Assessment & Plan Note (Signed)
-   Secondary to cellulitis - CBC in a.m.

## 2021-12-30 NOTE — Assessment & Plan Note (Signed)
Stroke-like symptoms - Query TIA, of note patient was recently discontinued on home Aggrenox outpatient - I have resumed home aspirin 81 mg daily for 12/31/2021 - Neurology has been consulted and we appreciate further recommendations - Complete echo ordered and MRI of the brain without contrast ordered - Fasting lipid and A1c ordered - Permissive hypertension - Frequent neuro vascular checks - PT, OT - Fall precaution

## 2021-12-30 NOTE — Assessment & Plan Note (Signed)
  -   With marginated regions and increased warmth on physical exam - Status post ceftriaxone 2 g IV per EDP - We will continue with ceftriaxone 1 g IV daily starting on 12/31/2021

## 2021-12-30 NOTE — Code Documentation (Signed)
Stroke Response Nurse Documentation Code Documentation  Evan Cole. is a 88 y.o. male arriving to [facility] via [facility] on 12/30/2021 with past medical hx of HTN, TIA, Frequent Falls. Code stroke was activated by ED.   Patient from [facility] where he was LKW at 2100 Last night per him and now complaining of left sided weakness. Pt reports going to bed at baseline and then waking up with left hand weakness and numbness to the left face.   Stroke team at the bedside on patient arrival. Labs drawn and patient to CT with team. NIHSS 4, see documentation for details and code stroke times. Patient with disoriented, left arm weakness, left leg weakness, and left decreased sensation on exam. The following imaging was completed:  CT Head. Patient is not a candidate for IV Thrombolytic due to outside window. Patient is not a candidate for IR due to LVO not suspected by MD Leonel Ramsay.   Care Plan: q2 NIHSS/VS.   Bedside handoff with ED RN Levada Dy.    Kathrin Greathouse  Stroke Response RN

## 2021-12-30 NOTE — ED Triage Notes (Signed)
Pt to ED via [facility] from [facility] for Left sided weakness. Per EMS pt brought in because staff noticed that at lunch he was not able to hold his spoon like normal. Pt reports that he went to bed normal last night at 9 pm.. This morning he woke up and noticed that he was not able to do things as normal with his left hand. Pt states that the left hand feels weaker than the right. Pt states that he is also having increased in blurred vision. Pt has left facial droop and has weaker grip on the left.

## 2021-12-30 NOTE — H&P (Signed)
History and Physical   Evan Cole. JQB:[id number] DOB: [birth year] DOA: 12/30/2021  PCP: Venia Carbon, MD  Patient coming from: [facility] via EMS  I have personally briefly reviewed patient's old medical records in [place].  Chief Concern: Left upper extremity weakness  HPI: [name].-year-old male with hyperlipidemia, hypertension, insomnia, GERD, restless leg syndrome, depression, anxiety, BPH, essential tremors, history of prostate cancer, history of TIA currently on Aggrenox, who presents to the emergency department for chief concerns of left-sided weakness and difficulty eating.  Initial vitals in the emergency department showed temperature of 97.9, respiration rate of 19, heart rate of 69, blood pressure 167/74, SpO2 of 99% on room air.  Serum sodium is 135, potassium 4.1, chloride 103, bicarb 23, BUN of 36, serum creatinine 1.80, nonfasting blood glucose 103, GFR 33.  WBC elevated at 15.4, hemoglobin 10.4, platelets of 521.  Code stroke was called.  Neurology was consulted.  Patient was outside TNK window.  ED treatment: Ceftriaxone 2 g IV one-time dose.  At bedside he is able to tell me his full name, his age correctly, and the current location of hospital.  He reports that his left upper extremity has been weak since yesterday 12/29/2021 in the evening.  He reports it is still weak.  He denies chest pain, shortness of breath.  Social history: Patient is from [facility].  He states that his wife is also living at [facility] and in the unit across the hall from him.  He formally smoked a pipe and has not smoked in many years.  He denies current EtOH and recreational drug use.  ROS: Constitutional: no weight change, no fever ENT/Mouth: no sore throat, no rhinorrhea Eyes: no eye pain, no vision changes Cardiovascular: no chest pain, no dyspnea,  no edema, no palpitations Respiratory: no cough, no sputum, no wheezing Gastrointestinal: no nausea, no  vomiting, no diarrhea, no constipation Genitourinary: no urinary incontinence, no dysuria, no hematuria Musculoskeletal: no arthralgias, no myalgias Skin: no skin lesions, no pruritus, Neuro: + weakness of his left upper extremity, no loss of consciousness, no syncope Psych: no anxiety, no depression, no decrease appetite Heme/Lymph: no bruising, no bleeding  ED Course: Discussed with emergency medicine provider, patient requiring hospitalization for chief concerns of TIA/stroke workup.  Assessment/Plan  Principal Problem:   TIA (transient ischemic attack) Active Problems:   Essential hypertension   Dyslipidemia   Depression with anxiety   Leukocytosis   Cellulitis of left leg   DNR (do not resuscitate)   Assessment and Plan:  * TIA (transient ischemic attack) Stroke-like symptoms - Query TIA, of note patient was recently discontinued on home Aggrenox outpatient - I have resumed home aspirin 81 mg daily for 12/31/2021 - Neurology has been consulted and we appreciate further recommendations - Complete echo ordered and MRI of the brain without contrast ordered - Fasting lipid and A1c ordered - Permissive hypertension - Frequent neuro vascular checks - PT, OT - Fall precaution  Cellulitis of left leg    - With marginated regions and increased warmth on physical exam - Status post ceftriaxone 2 g IV per EDP - We will continue with ceftriaxone 1 g IV daily starting on 12/31/2021  Leukocytosis - Secondary to cellulitis - CBC in a.m.  Depression with anxiety - Abilify 5 mg daily, bupropion 150 mg daily, sertraline 150 mg daily resumed  Dyslipidemia - Atorvastatin 40 mg daily resumed  Essential hypertension - Losartan 50 mg nightly resumed -  Hydralazine 10 mg p.o. every 6 hours as needed for SBP greater than 180, 4 days ordered  Chart reviewed.   DVT prophylaxis: Enoxaparin Code Status: DNR/DNI, confirmed with patient at bedside and per MOST form at bedside Diet:  Heart healthy Family Communication: Attempted to call spouse at [phone], no pickup and no voice message available Disposition Plan: Pending clinical course Consults called: Neurology Admission status: Telemetry medical, observation  Past Medical History:  Diagnosis Date   Frequent falls    Hypertension    Shoulder dislocation    TIA (transient ischemic attack)    Past Surgical History:  Procedure Laterality Date   APPENDECTOMY     HERNIA REPAIR     Social History:  reports that he has been smoking pipe. He uses smokeless tobacco. He reports current alcohol use. He reports that he does not use drugs.  Allergies  Allergen Reactions   Doxycycline Hives and Rash   Benzonatate Other (See Comments)   Simvastatin Other (See Comments)   Tetanus Toxoid Other (See Comments)    Tetanus (horse-serum based tetanus)- unknown  Other reaction(s): Unknown Tetanus (horse-serum based tetanus)- unknown  Other reaction(s): Unknown Tetanus (horse-serum based tetanus)- unknown Other reaction(s): Unknown Tetanus (horse-serum based tetanus)- unknown    Codeine Nausea Only and Other (See Comments)    GI upset    History reviewed. No pertinent family history. Family history: Family history reviewed and not pertinent  Prior to Admission medications   Medication Sig Start Date End Date Taking? Authorizing Provider  acetaminophen (TYLENOL) 650 MG CR tablet Take 650 mg by mouth at bedtime. 12/03/21  Yes [provider]  ARIPiprazole (ABILIFY) 5 MG tablet Take 5 mg by mouth daily. 12/06/21  Yes [provider]  aspirin EC 81 MG tablet Take 81 mg by mouth daily. Swallow whole.   Yes [provider]  atorvastatin (LIPITOR) 40 MG tablet TAKE 1 TABLET BY MOUTH EVERY DAY 05/03/18  Yes [provider]  buPROPion (WELLBUTRIN XL) 150 MG 24 hr tablet Take 150 mg by mouth every morning. 12/29/21  Yes [provider]  calcium-vitamin D (OSCAL WITH D) 500-5 MG-MCG  tablet Take 1 tablet by mouth at bedtime.   Yes [provider]  docusate sodium (COLACE) 100 MG capsule Take 100 mg by mouth 2 (two) times daily.   Yes [provider]  losartan (COZAAR) 50 MG tablet TAKE 1 TABLET BY MOUTH EVERYDAY AT BEDTIME 05/12/18  Yes [provider]  Melatonin 3 MG TABS Take 5 mg by mouth at bedtime.   Yes [provider]  Multiple Vitamins-Minerals (PRESERVISION AREDS PO) Take 2 capsules by mouth in the morning and at bedtime.   Yes [provider]  MYRBETRIQ 50 MG TB24 tablet Take 50 mg by mouth daily. 12/23/21  Yes [provider]  omeprazole (PRILOSEC) 40 MG capsule Take 40 mg by mouth daily. 12/06/21  Yes [provider]  Polyethyl Glycol-Propyl Glycol (SYSTANE) 0.4-0.3 % SOLN Place 1 drop into both eyes every 4 (four) hours as needed.   Yes [provider]  polyethylene glycol (MIRALAX / GLYCOLAX) 17 g packet Take 17 g by mouth daily as needed for moderate constipation.   Yes [provider]  rOPINIRole (REQUIP) 0.5 MG tablet Take 0.5 mg by mouth at bedtime. 12/03/21  Yes [provider]  sertraline (ZOLOFT) 100 MG tablet Take 150 mg by mouth daily. 02/19/18  Yes [provider]  tamsulosin (FLOMAX) 0.4 MG CAPS capsule Take 0.4 mg  by mouth daily. 11/27/21  Yes [provider]  albuterol (PROAIR HFA) 108 (90 Base) MCG/ACT inhaler ProAir HFA 90 mcg/actuation aerosol inhaler  INHALE 2 PUFFS INTO THE LUNGS EVERY 4 HOURS AS NEEDED FOR WHEEZING OR SHORTNESS OF BREATH Patient not taking: Reported on 12/30/2021    [provider]  cefUROXime (CEFTIN) 500 MG tablet cefuroxime axetil 500 mg tablet Patient not taking: Reported on 12/30/2021    [provider]  citalopram (CELEXA) 20 MG tablet citalopram 20 mg tablet Patient not taking: Reported on 12/30/2021 06/25/15   [provider]  dipyridamole-aspirin (AGGRENOX) 200-25 MG 12hr capsule aspirin 25  mg-dipyridamole 200 mg capsule,ext.release 12 hr multiphase Patient not taking: Reported on 12/30/2021 06/22/15   [provider]  fexofenadine (ALLEGRA) 180 MG tablet fexofenadine 180 mg tablet  TAKE 1 TABLET (180 MG TOTAL) BY MOUTH ONCE DAILY. *OTC - NOT COVERED*    [provider]  fluvastatin (LESCOL) 40 MG capsule fluvastatin 40 mg capsule Patient not taking: Reported on 12/30/2021 04/20/15   [provider]  Influenza vac split quadrivalent PF (FLUZONE HIGH-DOSE) 0.5 ML injection Fluzone High-Dose 2016-2017 (PF) 180 mcg/0.5 mL intramuscular syringe  TO BE ADMINISTERED BY PHARMACIST FOR IMMUNIZATION    [provider]  MAGNESIUM CARBONATE PO Take by mouth. Patient not taking: Reported on 12/30/2021    [provider]  mirabegron ER (MYRBETRIQ) 25 MG TB24 tablet Myrbetriq 25 mg tablet,extended release Patient not taking: Reported on 12/30/2021 02/21/18   [provider]  predniSONE (DELTASONE) 10 MG tablet prednisone 10 mg tablet Patient not taking: Reported on 12/30/2021    [provider]  hydrochlorothiazide (HYDRODIURIL) 25 MG tablet hydrochlorothiazide 25 mg tablet 03/19/15 03/30/20  [provider]   Physical Exam: Vitals:   12/30/21 1530 12/30/21 1600 12/30/21 1630 12/30/21 1754  BP: (!) 165/78 (!) 167/74 (!) 169/84 (!) 170/83  Pulse: 76 69 70 73  Resp: '18 19 15 20  '$ Temp:    98.6 F (37 C)  TempSrc:      SpO2: 94% 99% 100% 96%  Weight:      Height:       Constitutional: appears age-appropriate, frail, NAD, calm, comfortable Eyes: PERRL, lids and conjunctivae normal ENMT: Mucous membranes are moist. Posterior pharynx clear of any exudate or lesions. Age-appropriate dentition.  Bilateral moderate to severe hearing loss Neck: normal, supple, no masses, no thyromegaly Respiratory: clear to auscultation bilaterally, no wheezing, no crackles. Normal respiratory effort. No accessory muscle use.  Cardiovascular:  Regular rate and rhythm, no murmurs / rubs / gallops. No extremity edema. 2+ pedal pulses. No carotid bruits.  Abdomen: no tenderness, no masses palpated, no hepatosplenomegaly. Bowel sounds positive.  Musculoskeletal: no clubbing / cyanosis. No joint deformity upper and lower extremities. Good ROM, no contractures, no atrophy. Normal muscle tone.  Skin:  No induration.  left lower extremity redness and increased warmth   Neurologic: Sensation intact. Strength 5/5 right upper extremity.  Left upper extremity has 4 out of 5 strength.  Bilateral lower extremity weakness. Psychiatric: Normal judgment and insight. Alert and oriented x 3. Normal mood.   EKG: independently reviewed, showing sinus rhythm with a rate of 72, QTc 474  Chest x-ray on Admission: I personally reviewed and I agree with radiologist reading as below.  DG Chest Port 1 View  Result Date: 12/30/2021 CLINICAL DATA:  Left-sided weakness EXAM: PORTABLE CHEST 1 VIEW COMPARISON:  03/30/2020 FINDINGS: Cardiac size is within normal limits. There are no signs of pulmonary  edema or new focal infiltrates. Calcified granulomas are seen in left lower lung field. There is no pleural effusion or pneumothorax. Deformity in proximal left humerus may be residual from previous injury. Degenerative changes are noted in left shoulder. IMPRESSION: There are no signs of pulmonary edema or new focal infiltrates. Electronically Signed   By: Elmer Picker M.D.   On: 12/30/2021 17:20   CT HEAD CODE STROKE WO CONTRAST  Result Date: 12/30/2021 CLINICAL DATA:  Code stroke. Acute onset of left upper extremity weakness and blurred vision. Left-sided weakness and facial droop. Last known well at 9 p.m. last night. EXAM: CT HEAD WITHOUT CONTRAST TECHNIQUE: Contiguous axial images were obtained from the base of the skull through the vertex without intravenous contrast. RADIATION DOSE REDUCTION: This exam was performed according to the departmental  dose-optimization program which includes automated exposure control, adjustment of the mA and/or kV according to patient size and/or use of iterative reconstruction technique. COMPARISON:  CT head without contrast 03/30/2020 FINDINGS: Brain: No acute infarct, hemorrhage, or mass lesion is present. Basal ganglia are intact. Insular ribbon is normal. No acute or focal cortical abnormality is present. Moderate generalized atrophy and white matter disease is present. The ventricles are proportionate to the degree of atrophy. No significant extraaxial fluid collection is present. No significant interval change is present. Vascular: Atherosclerotic calcifications are present within the cavernous internal carotid arteries bilaterally. No hyperdense vessel is present. Skull: Calvarium is intact. No focal lytic or blastic lesions are present. No significant extracranial soft tissue lesion is present. Sinuses/Orbits: The paranasal sinuses and mastoid air cells are clear. Bilateral lens replacements are noted. Globes and orbits are otherwise unremarkable. ASPECTS [facility] Stroke Program Early CT Score) - Ganglionic level infarction (caudate, lentiform nuclei, internal capsule, insula, M1-M3 cortex): 7/7 - Supraganglionic infarction (M4-M6 cortex): 3/3 Total score (0-10 with 10 being normal): 10/10 IMPRESSION: 1. No acute intracranial abnormality or significant interval change. 2. Stable moderate generalized atrophy and white matter disease. This likely reflects the sequela of chronic microvascular ischemia. The above was relayed via text pager to Dr. Leonel Ramsay on 12/30/2021 at 15:12 . Electronically Signed   By: San Morelle M.D.   On: 12/30/2021 15:12    Labs on Admission: I have personally reviewed following labs  CBC: Recent Labs  Lab 12/30/21 1515  WBC 15.4*  NEUTROABS 11.2*  HGB 10.4*  HCT 33.0*  MCV 90.9  PLT 947*   Basic Metabolic Panel: Recent Labs  Lab 12/30/21 1509 12/30/21 1515  NA  --   135  K  --  4.1  CL  --  103  CO2  --  23  GLUCOSE  --  103*  BUN  --  36*  CREATININE 2.10* 1.80*  CALCIUM  --  8.7*   GFR: Estimated Creatinine Clearance: 22.5 mL/min (A) (by C-G formula based on SCr of 1.8 mg/dL (H)).  Liver Function Tests: Recent Labs  Lab 12/30/21 1515  AST 28  ALT 24  ALKPHOS 60  BILITOT 0.6  PROT 6.6  ALBUMIN 3.0*   Coagulation Profile: Recent Labs  Lab 12/30/21 1515  INR 1.1   CBG: Recent Labs  Lab 12/30/21 1449  GLUCAP 96   Urine analysis:    Component Value Date/Time   COLORURINE STRAW (A) 03/30/2020 1123   APPEARANCEUR CLEAR (A) 03/30/2020 1123   APPEARANCEUR Clear 10/25/2011 1827   LABSPEC 1.013 03/30/2020 1123   LABSPEC 1.042 10/25/2011 1827   PHURINE 6.0 03/30/2020 1123   GLUCOSEU NEGATIVE 03/30/2020  [place] Negative 10/25/2011 [place] 03/30/2020 [place] 03/30/2020 1123   BILIRUBINUR Negative 10/25/2011 1827   KETONESUR NEGATIVE 03/30/2020 1123   PROTEINUR NEGATIVE 03/30/2020 1123   NITRITE NEGATIVE 03/30/2020 1123   LEUKOCYTESUR NEGATIVE 03/30/2020 1123   LEUKOCYTESUR Negative 10/25/2011 1827   Dr. Tobie Poet [facility]  If 7PM-7AM, please contact overnight-coverage provider If 7AM-7PM, please contact day coverage provider [email or web address]  12/30/2021, 6:49 PM

## 2021-12-31 ENCOUNTER — Observation Stay: Admit: 2021-12-31 | Payer: Medicare Other

## 2021-12-31 ENCOUNTER — Observation Stay: Payer: Medicare Other

## 2021-12-31 ENCOUNTER — Observation Stay
Admit: 2021-12-31 | Discharge: 2021-12-31 | Disposition: A | Discharge status: Home / Self Care | Payer: Medicare Other | Attending: Family Medicine | Admitting: Family Medicine

## 2021-12-31 DIAGNOSIS — L8990 Pressure ulcer of unspecified site, unspecified stage: Secondary | ICD-10-CM | POA: Insufficient documentation

## 2021-12-31 DIAGNOSIS — N1832 Chronic kidney disease, stage 3b: Secondary | ICD-10-CM

## 2021-12-31 DIAGNOSIS — G459 Transient cerebral ischemic attack, unspecified: Secondary | ICD-10-CM | POA: Diagnosis not present

## 2021-12-31 DIAGNOSIS — I10 Essential (primary) hypertension: Secondary | ICD-10-CM | POA: Diagnosis not present

## 2021-12-31 LAB — BASIC METABOLIC PANEL
Anion gap: 8 (ref 5–15)
BUN: 35 mg/dL — ABNORMAL HIGH (ref 8–23)
CO2: 21 mmol/L — ABNORMAL LOW (ref 22–32)
Calcium: 8.5 mg/dL — ABNORMAL LOW (ref 8.9–10.3)
Chloride: 108 mmol/L (ref 98–111)
Creatinine, Ser: 1.62 mg/dL — ABNORMAL HIGH (ref 0.61–1.24)
GFR, Estimated: 38 mL/min — ABNORMAL LOW (ref >60)
Glucose, Bld: 102 mg/dL — ABNORMAL HIGH (ref 70–99)
Potassium: 3.9 mmol/L (ref 3.5–5.1)
Sodium: 137 mmol/L (ref 135–145)

## 2021-12-31 LAB — URINALYSIS, ROUTINE W REFLEX MICROSCOPIC
Bilirubin Urine: NEGATIVE
Glucose, UA: NEGATIVE mg/dL
Hgb urine dipstick: NEGATIVE
Ketones, ur: NEGATIVE mg/dL
Nitrite: NEGATIVE
Protein, ur: 100 mg/dL — AB
Specific Gravity, Urine: 1.013 (ref 1.005–1.030)
WBC, UA: >50 WBC/hpf — ABNORMAL HIGH (ref 0–5)
pH: 5 (ref 5.0–8.0)

## 2021-12-31 LAB — LIPID PANEL
Cholesterol: 107 mg/dL (ref 0–200)
HDL: 26 mg/dL — ABNORMAL LOW (ref >40)
LDL Cholesterol: 57 mg/dL (ref 0–99)
Total CHOL/HDL Ratio: 4.1 RATIO
Triglycerides: 122 mg/dL (ref <150)
VLDL: 24 mg/dL (ref 0–40)

## 2021-12-31 LAB — HEMOGLOBIN A1C
Hgb A1c MFr Bld: 5.3 % (ref 4.8–5.6)
Mean Plasma Glucose: 105.41 mg/dL

## 2021-12-31 MED ORDER — CEFADROXIL 500 MG PO CAPS: 500.0000 mg | ORAL_CAPSULE | Freq: Two times a day (BID) | ORAL | 0 refills | Status: DC

## 2021-12-31 MED ORDER — CLOPIDOGREL BISULFATE 75 MG PO TABS: 75.0000 mg | ORAL_TABLET | Freq: Every day | ORAL | Status: DC

## 2021-12-31 MED ORDER — PERFLUTREN LIPID MICROSPHERE
1.0000 mL | INTRAVENOUS | Status: AC | PRN
  Administered 2021-12-31: 3 mL via INTRAVENOUS

## 2021-12-31 MED ORDER — SODIUM CHLORIDE 0.9 % IV SOLN
2.0000 g | INTRAVENOUS | Status: DC
  Administered 2021-12-31: 2 g via INTRAVENOUS
  Filled 2021-12-31: qty 20

## 2021-12-31 MED ORDER — ENOXAPARIN SODIUM 30 MG/0.3ML IJ SOSY
30.0000 mg | PREFILLED_SYRINGE | Freq: Every day | INTRAMUSCULAR | Status: DC
  Administered 2021-12-31: 30 mg via SUBCUTANEOUS
  Filled 2021-12-31: qty 0.3

## 2021-12-31 MED ORDER — MEDIHONEY WOUND/BURN DRESSING EX PSTE
1.0000 | PASTE | Freq: Every day | CUTANEOUS | Status: DC
  Administered 2021-12-31: 1 via TOPICAL
  Filled 2021-12-31: qty 1

## 2021-12-31 MED ORDER — CLOPIDOGREL BISULFATE 75 MG PO TABS: 75.0000 mg | ORAL_TABLET | Freq: Every day | ORAL | 0 refills | Status: DC

## 2021-12-31 NOTE — Consult Note (Addendum)
WOC Nurse Consult Note: Reason for Consult: Consult requested for right anterior foot and right ankle. Right anterior foot with full thickness wound; .5X.5X.2cm, 100% yellow and moist, small amt yellow drainage Right outer ankle with Unstageable pressure injury; .8X.8cm, 50% yellow, 50% red, small amt yellow drainage.  Float RLE in [place] boot to reduce pressure. Topical treatment orders provided for bedside nurses to perform as follows to assist with removal of nonviable tissue: Apply Medihoney to right anterior foot and right outer ankle Q day, then cover with foam dressing.  (Change foam dressing Q 3 days or PRN soiling.) Please re-consult if further assistance is needed.  Thank-you,  Julien Girt MSN, [place], [place], [place], [place]

## 2021-12-31 NOTE — NC FL2 (Addendum)
[place] LEVEL OF CARE SCREENING TOOL     IDENTIFICATION  Patient Name: Evan Cole. Birthdate: February 03, 1920 Sex: male Admission Date (Current Location): 12/30/2021  [facility] and [place] Number:  Engineering geologist and Address:  [facility], [address] [place], [place] [address]      Provider Number: [id number]  Attending Physician Name and Address:  Edwin Dada, *  Relative Name and Phone Number:  Soyla Dryer (Daughter)   [phone] [facility])    Current Level of Care: Hospital Recommended Level of Care: [place] Prior Approval Number:    Date Approved/Denied:   PASRR Number: [id number]  Discharge Plan: SNF    Current Diagnoses: Patient Active Problem List   Diagnosis Date Noted   TIA (transient ischemic attack) 12/30/2021   Cellulitis of left leg 12/30/2021   Pressure injury of skin 12/31/2021   Stage 3b chronic kidney disease (CKD) ([place]) 12/31/2021   Essential hypertension 12/30/2021   Dyslipidemia 12/30/2021   Depression with anxiety 12/30/2021   DNR (do not resuscitate) 12/30/2021    Orientation RESPIRATION BLADDER Height & Weight     Self, Time, Situation, Place (forgetful)  Normal Incontinent Weight: 71 kg Height:  '5\' 10"'$  (177.8 cm)  BEHAVIORAL SYMPTOMS/MOOD NEUROLOGICAL BOWEL NUTRITION STATUS      Incontinent Diet (Heart Healthy)  AMBULATORY STATUS COMMUNICATION OF NEEDS Skin   Total Care Verbally PU Stage and Appropriate Care (Stage 2 right anterior foot foam dressing)   PU Stage 2 Dressing:  (foam)                   Personal Care Assistance Level of Assistance  Bathing, Feeding, Dressing Bathing Assistance: Maximum assistance Feeding assistance: Maximum assistance Dressing Assistance: Maximum assistance     Functional Limitations Info  Sight, Hearing, Speech Sight Info: Impaired Hearing Info: Impaired Speech Info: Adequate    SPECIAL CARE FACTORS FREQUENCY                        Contractures Contractures Info: Not present    Additional Factors Info  Code Status, Allergies Code Status Info: DNR Allergies Info: Doxycycline High  Hives, Rash   Benzonatate Not Specified  Other (See Comments)   Simvastatin Not Specified  Other (See Comments)   Tetanus Toxoid Not Specified  Other (See Comments) Tetanus (horse-serum based tetanus)- unknown Other reaction(s): Unknown Tetanus (horse-serum based tetanus)- unknown Other reaction(s): Unknown Tetanus (horse-serum based tetanus)- unknown Other reaction(s): Unknown Tetanus (horse-serum based tetanus)- unknown  Codeine           Current Medications (12/31/2021):  This is the current hospital active medication list Current Facility-Administered Medications  Medication Dose Route Frequency Provider Last Rate Last Admin   acetaminophen (TYLENOL) tablet 650 mg  650 mg Oral Q4H PRN Cox, Amy N, DO   650 mg at 12/31/21 1132   Or   acetaminophen (TYLENOL) 160 MG/5ML solution 650 mg  650 mg Per Tube Q4H PRN Cox, Amy N, DO       Or   acetaminophen (TYLENOL) suppository 650 mg  650 mg Rectal Q4H PRN Cox, Amy N, DO       ARIPiprazole (ABILIFY) tablet 5 mg  5 mg Oral Daily Cox, Amy N, DO   5 mg at 12/31/21 1006   aspirin EC tablet 81 mg  81 mg Oral Daily Cox, Amy N, DO   81 mg at 12/31/21 1006   atorvastatin (LIPITOR) tablet 40  mg  40 mg Oral Daily Cox, Amy N, DO   40 mg at 12/31/21 1005   buPROPion (WELLBUTRIN XL) 24 hr tablet 150 mg  150 mg Oral q morning Cox, Amy N, DO   150 mg at 12/31/21 1006   cefTRIAXone (ROCEPHIN) 2 g in sodium chloride 0.9 % 100 mL IVPB  2 g Intravenous Q24H Wynelle Cleveland, RPH       clopidogrel (PLAVIX) tablet 75 mg  75 mg Oral Daily Greta Doom, MD   75 mg at 12/31/21 1006   docusate sodium (COLACE) capsule 100 mg  100 mg Oral BID Cox, Amy N, DO   100 mg at 12/31/21 1006   enoxaparin (LOVENOX) injection 30 mg  30 mg Subcutaneous QHS Mickeal Skinner A, RPH        hydrALAZINE (APRESOLINE) injection 5 mg  5 mg Intravenous Q6H PRN Cox, Amy N, DO       leptospermum manuka honey (MEDIHONEY) paste 1 Application  1 Application Topical Daily Jodelle Fausto, Suann Larry, MD   1 Application at 16/10/96 1224   melatonin tablet 5 mg  5 mg Oral QHS Cox, Amy N, DO   5 mg at 12/30/21 2103   mirabegron ER (MYRBETRIQ) tablet 50 mg  50 mg Oral Daily Cox, Amy N, DO   50 mg at 12/31/21 1006   polyethylene glycol (MIRALAX / GLYCOLAX) packet 17 g  17 g Oral Daily PRN Cox, Amy N, DO       rOPINIRole (REQUIP) tablet 0.5 mg  0.5 mg Oral QHS Cox, Amy N, DO   0.5 mg at 12/30/21 2104   senna-docusate (Senokot-S) tablet 1 tablet  1 tablet Oral QHS PRN Cox, Amy N, DO       sertraline (ZOLOFT) tablet 150 mg  150 mg Oral Daily Cox, Amy N, DO   150 mg at 12/31/21 1006   tamsulosin (FLOMAX) capsule 0.4 mg  0.4 mg Oral Daily Cox, Amy N, DO   0.4 mg at 12/31/21 1006   Facility-Administered Medications Ordered in Other Encounters  Medication Dose Route Frequency Provider Last Rate Last Admin   perflutren lipid microspheres (DEFINITY) IV suspension  1-10 mL Intravenous PRN Edwin Dada, MD   3 mL at 12/31/21 1414     Discharge Medications: Please see discharge summary for a list of discharge medications.  Relevant Imaging Results:  Relevant Lab Results:   Additional Information SSN [id number]  Edwin Dada, MD

## 2021-12-31 NOTE — TOC Initial Note (Addendum)
Transition of Care (TOC) - Initial/Assessment Note    Patient Details  Name: Evan Cole. MRN: [id number] Date of Birth: 1920-03-18  Transition of Care [facility]) CM/SW Contact:    Pete Pelt, RN Phone Number: 12/31/2021, 3:27 PM  Clinical Narrative:     Patient sleeping spoke with granddaughter, who is at bedside.  Patient is a resident of [facility].  Seth Bake and [employer] of potential discharge today.  As per hospitalist, still waiting on testing to finalize discharge.  Hospitalist spoke to tamily.  TOC to follow.              Addendum: as per hospitalist, family and Albina Billet at [facility], patient to discharge today.  Albina Billet states will discharge to room 513 and they can receive him at any time.  Expected Discharge Plan: Skilled Nursing Facility Barriers to Discharge: Continued Medical Work up   Patient Goals and CMS Choice        Expected Discharge Plan and Services Expected Discharge Plan: [place] arrangements for the past 2 months: [place]                                      Prior Living Arrangements/Services Living arrangements for the past 2 months: [place] Lives with:: Facility Resident Patient language and need for interpreter reviewed:: Yes Do you feel safe going back to the place where you live?: Yes      Need for Family Participation in Patient Care: Yes (Comment) Care giver support system in place?: Yes (comment)   Criminal Activity/Legal Involvement Pertinent to Current Situation/Hospitalization: No - Comment as needed  Activities of Daily Living Home Assistive Devices/Equipment: Wheelchair ADL Screening (condition at time of admission) Patient's cognitive ability adequate to safely complete daily activities?: No Is the patient deaf or have difficulty hearing?: Yes Does the patient have difficulty seeing, even when wearing glasses/contacts?:  Yes Does the patient have difficulty concentrating, remembering, or making decisions?: No Patient able to express need for assistance with ADLs?: Yes Does the patient have difficulty dressing or bathing?: Yes Independently performs ADLs?: No Communication: Dependent Is this a change from baseline?: Pre-admission baseline Dressing (OT): Dependent Is this a change from baseline?: Pre-admission baseline Grooming: Dependent Is this a change from baseline?: Pre-admission baseline Feeding: Independent Bathing: Dependent Is this a change from baseline?: Pre-admission baseline Toileting: Dependent Is this a change from baseline?: Pre-admission baseline In/Out Bed: Dependent Is this a change from baseline?: Pre-admission baseline Walks in Home: Dependent Is this a change from baseline?: Pre-admission baseline Does the patient have difficulty walking or climbing stairs?: Yes Weakness of Legs: None Weakness of Arms/Hands: Left  Permission Sought/Granted Permission sought to share information with : Facility Sport and exercise psychologist, Case Optician, dispensing granted to share information with : Yes, Verbal Permission Granted     Permission granted to share info w AGENCY: [facility] (patient is a resident)        Emotional Assessment Appearance:: Appears stated age Attitude/Demeanor/Rapport:  (Patient sleeping, spoke with daughter)     Alcohol / Substance Use: Not Applicable Psych Involvement: No (comment)  Admission diagnosis:  TIA (transient ischemic attack) [G45.9] Patient Active Problem List   Diagnosis Date Noted   Pressure injury of skin 12/31/2021   Stage 3b chronic kidney disease (CKD) ([place]) 12/31/2021   TIA (transient ischemic  attack) 12/30/2021   Essential hypertension 12/30/2021   Dyslipidemia 12/30/2021   Depression with anxiety 12/30/2021   Cellulitis of left leg 12/30/2021   DNR (do not resuscitate) 12/30/2021   PCP:  Venia Carbon, MD Pharmacy:   [id number] [id number]: [phone]: [phone]    Social Determinants of Health (SDOH) Interventions    Readmission Risk Interventions     No data to display

## 2021-12-31 NOTE — Progress Notes (Signed)
*  PRELIMINARY RESULTS* Echocardiogram 2D Echocardiogram has been performed.  Sherrie Sport 12/31/2021, 2:16 PM

## 2021-12-31 NOTE — Progress Notes (Signed)
Subjective: Hand is better, still with some facial weakness.   Exam: Vitals:   12/31/21 0508 12/31/21 0803  BP: 137/69 (!) 140/71  Pulse: 72 72  Resp: 16 18  Temp: 97.9 F (36.6 C) 98.1 F (36.7 C)  SpO2: 95% 96%   Gen: In bed, NAD Resp: non-labored breathing, no acute distress Abd: soft, nt  Neuro: MS: awake, alert, oriented to month and year.  CN:VFF, eomi, he has a mild left facial weakness Motor: Moves all extremities well, improved strength in his left hand now compared to yesterday 4+/5 Sensory: Intact to light touch   Pertinent Labs: LDL 57 A1c 5.3  Impression: 88 year old male with transient left hand numbness and some persistent left facial weakness.  With negative MRI, I do wonder if this could be recrudescence of previous symptoms, no urinalysis has been performed thus far and therefore I would recommend this.  He has had significant improvement, so TIA is a possibility as well.  If his echo is negative, no further recommendations from a stroke work-up I would do dual antiplatelet therapy for 3 weeks and then return to Plavix monotherapy.  I discussed this with his daughter and granddaughter.  Recommendations: 1) aspirin 81 mg and Plavix 75 mg daily for 3 weeks 2) if echo and telemetry is negative, no further recommendations from a stroke work-up 3) PT, OT at his facility 4) neurology will remain available as needed.   Roland Rack, MD [facility] [phone]  If 7pm- 7am, please page neurology on call as listed in [place].

## 2021-12-31 NOTE — Evaluation (Signed)
Occupational Therapy Evaluation Patient Details Name: Evan Cole. MRN: [id number] DOB: 1919/11/21 Today's Date: 12/31/2021   History of Present Illness Pt is a 88 y/o M admitted on 12/30/21 after presenting with c/o L sided weakness & difficulty eating.  Pt is being treated for TIA. PMH: HLD, HTN, insomnia, GERD, restless leg syndrome, depression, anxiety, BPH, essential tremors, prostate CA, TIA   Clinical Impression   Pt was seen for OT evaluation this date. Prior to hospital admission, pt was living at [facility] and receiving +1 assist for ADL transfers and able to self propel w/c until about 1 week ago when he began to become weaker and required +2 assist for ADL transfers. Pt endorsing discomfort (burning sensation in urethra, RN aware per pt/granddaughter) and wishes to return to [place] today. Pt presents to acute OT demonstrating impaired ADL performance and functional mobility 2/2 decreased strength, ROM in knee and hip flexion/extension, activity tolerance, and balance (See OT problem list). Pt currently requires MAX A for bed level ADL tasks and +2 MAX A for bed mobility. Pt would benefit from skilled OT services to address noted impairments and functional limitations (see below for any additional details) in order to maximize safety and independence while minimizing falls risk and caregiver burden. Upon hospital discharge, recommend STR to maximize pt safety and return to PLOF with decreased caregiver burden.     Recommendations for follow up therapy are one component of a multi-disciplinary discharge planning process, led by the attending physician.  Recommendations may be updated based on patient status, additional functional criteria and insurance authorization.   Follow Up Recommendations  Skilled nursing-short term rehab (<3 hours/day)    Assistance Recommended at Discharge Frequent or constant Supervision/Assistance  Patient can return home with the following A  lot of help with bathing/dressing/bathroom;Two people to help with walking and/or transfers;Assistance with cooking/housework;Assist for transportation;Help with stairs or ramp for entrance;Direct supervision/assist for medications management    Functional Status Assessment  Patient has had a recent decline in their functional status and demonstrates the ability to make significant improvements in function in a reasonable and predictable amount of time.  Equipment Recommendations  Other (comment) (defer to next venue)    Recommendations for Other Services       Precautions / Restrictions Precautions Precautions: Fall Restrictions Weight Bearing Restrictions: No      Mobility Bed Mobility Overal bed mobility: Needs Assistance Bed Mobility: Rolling Rolling: Mod assist, +2 for physical assistance, Max assist         General bed mobility comments: assist for log rolling for linens change and repositioning of pillows for comfort and joint/pressure relief    Transfers                          Balance                                           ADL either performed or assessed with clinical judgement   ADL                                         General ADL Comments: Pt currently requires MAXA - TOTAL for LB ADL, MOD A + for UB ADL tasks all  from bed level     Vision         Perception     Praxis      Pertinent Vitals/Pain Pain Assessment Pain Assessment: Faces Faces Pain Scale: Hurts little more Pain Location: RLE with touch during log rolling Pain Descriptors / Indicators: Grimacing, Moaning Pain Intervention(s): Limited activity within patient's tolerance, Repositioned     Hand Dominance Right   Extremity/Trunk Assessment Upper Extremity Assessment Upper Extremity Assessment: Generalized weakness   Lower Extremity Assessment Lower Extremity Assessment: Generalized weakness (Pt unable to tolerate BLE PROM to  even 45 degrees, doesn't fully extend BLE in bed, instead keeping them significantly flexed to trunk)   Cervical / Trunk Assessment Cervical / Trunk Assessment: Kyphotic   Communication Communication Communication: HOH (hearing aides)   Cognition Arousal/Alertness: Awake/alert Behavior During Therapy: WFL for tasks assessed/performed Overall Cognitive Status: Within Functional Limits for tasks assessed                                       General Comments       Exercises     Shoulder Instructions      Home Living Family/patient expects to be discharged to:: Skilled nursing facility                                 Additional Comments: [facility] term care      Prior Functioning/Environment Prior Level of Function : Needs assist             Mobility Comments: Pt required +1 assist to complete stand pivot bed<>w/c until ~1 week ago when pt began requiring +2 assist then STS lift & pt also demonstrated increasing difficulty propelling w/c.          OT Problem List: Decreased strength;Decreased range of motion;Impaired balance (sitting and/or standing);Decreased knowledge of use of DME or AE;Pain      OT Treatment/Interventions: Self-care/ADL training;Therapeutic exercise;Therapeutic activities;DME and/or AE instruction;Patient/family education;Balance training    OT Goals(Current goals can be found in the care plan section) Acute Rehab OT Goals Patient Stated Goal: go back to [place] today OT Goal Formulation: With patient/family Time For Goal Achievement: 01/14/22 Potential to Achieve Goals: Good ADL Goals Pt Will Transfer to Toilet:  (+1 assist, LRAD) Additional ADL Goal #1: Pt will complete bed mobility with MOD A from family/caregivers in order for repositioning for comfort as well as bed level ADL. Additional ADL Goal #2: Pt will sit EOB with intermittent assist for static sitting balance to complete grooming tasks,  5/5 opportunities.  OT Frequency: Min 2X/week    Co-evaluation              AM-PAC OT "6 Clicks" Daily Activity     Outcome Measure Help from another person eating meals?: A Little Help from another person taking care of personal grooming?: A Lot Help from another person toileting, which includes using toliet, bedpan, or urinal?: Total Help from another person bathing (including washing, rinsing, drying)?: A Lot Help from another person to put on and taking off regular upper body clothing?: A Lot Help from another person to put on and taking off regular lower body clothing?: A Lot 6 Click Score: 12   End of Session    Activity Tolerance: Other (comment) (limited by discomfort 2/2 "burning sensation" in urethra,  per pt/family RN is aware) Patient left: in bed;with call bell/phone within reach;with bed alarm set;Other (comment);with family/visitor present (pillows to support comfort and joint protection)  OT Visit Diagnosis: Other abnormalities of gait and mobility (R26.89);Muscle weakness (generalized) (M62.81)                Time: 5638-7564 OT Time Calculation (min): 14 min Charges:  OT General Charges $OT Visit: 1 Visit OT Evaluation $OT Eval Moderate Complexity: 1 Mod  Ardeth Perfect., MPH, MS, OTR/L ascom [phone] 12/31/21, 3:25 PM

## 2021-12-31 NOTE — Evaluation (Signed)
Physical Therapy Evaluation Patient Details Name: Evan Cole. MRN: [id number] DOB: 1919-07-23 Today's Date: 12/31/2021  History of Present Illness  Pt is a 88 y/o M admitted on 12/30/21 after presenting with c/o L sided weakness & difficulty eating.  Pt is being treated for TIA. PMH: HLD, HTN, insomnia, GERD, restless leg syndrome, depression, anxiety, BPH, essential tremors, prostate CA, TIA  Clinical Impression  Pt seen for PT evaluation with pt's granddaughter present. PLOF provided by granddaughter & [employer] member. One week prior to admission pt required +1 for stand pivot bed<>w/c & was able to self propel w/c. Over the past week, pt began requiring +2 assist for stand pivot, with staff eventually using STS lift. On this date, pt requires max +1-2 assist for supine>sit. Pt's facial muscles around mouth appear droopy but otherwise no deficits noted/observed. Pt requires max assist to come to upright sitting EOB but was able to progress to min assist for static sitting. Pt with decreased BLE knee extension (AROM or PROM) & unsafe to attempt bed>chair transfer with +1 assist. Will continue to follow pt acutely to address balance, ROM, bed mobility & transfers as able.    Recommendations for follow up therapy are one component of a multi-disciplinary discharge planning process, led by the attending physician.  Recommendations may be updated based on patient status, additional functional criteria and insurance authorization.  Follow Up Recommendations Skilled nursing-short term rehab (<3 hours/day) Can patient physically be transported by private vehicle: No    Assistance Recommended at Discharge Frequent or constant Supervision/Assistance  Patient can return home with the following  Two people to help with walking and/or transfers;Two people to help with bathing/dressing/bathroom;Help with stairs or ramp for entrance;Assistance with feeding;Assistance with cooking/housework;Assist  for transportation    Equipment Recommendations None recommended by PT  Recommendations for Other Services       Functional Status Assessment Patient has had a recent decline in their functional status and demonstrates the ability to make significant improvements in function in a reasonable and predictable amount of time.     Precautions / Restrictions Precautions Precautions: Fall Restrictions Weight Bearing Restrictions: No      Mobility  Bed Mobility Overal bed mobility: Needs Assistance Bed Mobility: Supine to Sit, Sit to Supine     Supine to sit: Max assist, HOB elevated Sit to supine: Max assist, HOB elevated, +2 for physical assistance   General bed mobility comments: +2 assist to scoot to [facility]    Transfers                        Ambulation/Gait                  Stairs            Wheelchair Mobility    Modified Rankin (Stroke Patients Only)       Balance Overall balance assessment: Needs assistance Sitting-balance support: Feet supported, Bilateral upper extremity supported Sitting balance-Leahy Scale: Poor Sitting balance - Comments: mod assist fade to min<>CGA assist with PT assisting with hand placement for upright posture vs R lateral lean                                     Pertinent Vitals/Pain Pain Assessment Pain Assessment: No/denies pain    Home Living Family/patient expects to be discharged to:: Skilled nursing facility  Additional Comments: [facility]    Prior Function Prior Level of Function : Needs assist             Mobility Comments: Pt required +1 assist to complete stand pivot bed<>w/c until ~1 week ago when pt began requiring +2 assist then STS lift & pt also demonstrated increasing difficulty propelling w/c.       Hand Dominance   Dominant Hand: Right    Extremity/Trunk Assessment   Upper Extremity Assessment Upper Extremity Assessment:  Generalized weakness    Lower Extremity Assessment Lower Extremity Assessment: Generalized weakness (Pt unable to tolerate BLE PROM to even 45 degrees, doesn't fully extend BLE in bed, instead keeping them significantly flexed to trunk)    Cervical / Trunk Assessment Cervical / Trunk Assessment: Kyphotic (rounded shoulders, forward head)  Communication   Communication: HOH (hearing aides)  Cognition Arousal/Alertness: Awake/alert Behavior During Therapy: WFL for tasks assessed/performed Overall Cognitive Status: Within Functional Limits for tasks assessed                                 General Comments: AxOx4        General Comments      Exercises     Assessment/Plan    PT Assessment Patient needs continued PT services  PT Problem List Decreased strength;Decreased range of motion;Decreased activity tolerance;Decreased balance;Decreased mobility;Decreased safety awareness;Decreased knowledge of use of DME       PT Treatment Interventions DME instruction;Therapeutic exercise;Gait training;Balance training;Neuromuscular re-education;Functional mobility training;Therapeutic activities;Patient/family education    PT Goals (Current goals can be found in the Care Plan section)  Acute Rehab PT Goals Patient Stated Goal: none stated PT Goal Formulation: With patient/family Time For Goal Achievement: 01/14/22 Potential to Achieve Goals: Fair    Frequency Min 2X/week     Co-evaluation               AM-PAC PT "6 Clicks" Mobility  Outcome Measure Help needed turning from your back to your side while in a flat bed without using bedrails?: Total Help needed moving from lying on your back to sitting on the side of a flat bed without using bedrails?: Total Help needed moving to and from a bed to a chair (including a wheelchair)?: Total Help needed standing up from a chair using your arms (e.g., wheelchair or bedside chair)?: Total Help needed to walk in  hospital room?: Total Help needed climbing 3-5 steps with a railing? : Total 6 Click Score: 6    End of Session   Activity Tolerance: Patient tolerated treatment well Patient left: in bed;with call bell/phone within reach;with bed alarm set;with family/visitor present Nurse Communication: Mobility status PT Visit Diagnosis: Muscle weakness (generalized) (M62.81);Other abnormalities of gait and mobility (R26.89)    Time: 0923-3007 PT Time Calculation (min) (ACUTE ONLY): 14 min   Charges:   PT Evaluation $PT Eval Moderate Complexity: [place], PT, DPT 12/31/21, 12:34 PM   Waunita Schooner 12/31/2021, 12:32 PM

## 2021-12-31 NOTE — Assessment & Plan Note (Signed)
Cr 1.5 in 2021.  None since.  At least CKD IIIB, maybe IV.

## 2021-12-31 NOTE — Progress Notes (Signed)
PHARMACIST - PHYSICIAN COMMUNICATION  CONCERNING:  Enoxaparin (Lovenox) for DVT Prophylaxis    RECOMMENDATION: Patient was prescribed enoxaprin '40mg'$  q24 hours for VTE prophylaxis.   Filed Weights   12/30/21 1520  Weight: 71 kg (156 lb 8.4 oz)    Body mass index is 22.46 kg/m.  Estimated Creatinine Clearance: 22.5 mL/min (A) (by C-G formula based on SCr of 1.8 mg/dL (H)).  Patient is candidate for enoxaparin '30mg'$  every 24 hours based on CrCl <92m/min or Weight <45kg  DESCRIPTION: [facility]  Patient is now receiving enoxaparin 30 mg every 24 hours    CGlean Salvo PharmD Clinical Pharmacist  12/31/2021 8:14 AM

## 2021-12-31 NOTE — Assessment & Plan Note (Signed)
Posterior right ankle, unstageable, POA Anterior right ankle, stage 2, POA

## 2021-12-31 NOTE — Assessment & Plan Note (Signed)
Cr 2.1 on admission.  Last baseline 1.5 2 years ago. Clinically undetermined chronicity.

## 2021-12-31 NOTE — Discharge Summary (Signed)
Physician Discharge Summary   Patient: Evan Cole. MRN: [id number] DOB: [birth year]  Admit date:     12/30/2021  Discharge date: 12/31/21  Discharge Physician: Edwin Dada   PCP: Venia Carbon, MD     Recommendations at discharge:  Follow up with PCP Dr. Silvio Pate in 1 week Dr. Silvio Pate: Please follow up echocardiogram     Discharge Diagnoses: Principal Problem:   TIA (transient ischemic attack) Active Problems:   Possible UTI   Essential hypertension   Dyslipidemia   Depression with anxiety   Pressure injury of skin   Stage 3b chronic kidney disease (CKD) [facility])     Hospital Course: Evan Cole is a 88 y.o. M with HTN, RLS, depression, essential tremor, hx TIA, hx pros CA who presented with acute left-sided weakness and difficulty eating.  In the ER, code stroke was called but he was outside tPA window. Cr was up from baseline and he had a leukocytosis. Neurology was consulted.        * TIA (transient ischemic attack) MRI showed no stroke.  Non-invasive angiography showed P3 stenosis, no other large vessel intracranial disease.  Normal carotids.    Echo pending at the time of discharge, but given patient age and goals of care, and low likelihood of embolic phenomena, this is to be followed up as an outpatient.      Lipids ordered: discharged on home atorvastatin.   -Aspirin ordered at admission --> discharged on aspirin and Plavix for 3 weeks then aspirin alone indefinitely.  Atrial fibrillation: not present       Possible UTI Given antibiotics for possible UTI.  Neurology noted cloudy urine and speculated that his symptoms may have been recrudescence of old stroke due to UTI.  Discharged on 3 more days cefadroxil.   Stage 3b chronic kidney disease (CKD) (HCC) Cr 1.5 in 2021.  None since.  At least CKD IIIB, possibly IV it is clinically undetermined at this time which.  Pressure injury of skin Posterior right ankle, unstageable, POA Anterior  right ankle, stage 2, POA Anterior left shin, stage 2, POA               The [place] Controlled Substances Registry was reviewed for this patient prior to discharge.  Consultants: Neurology Procedures performed: MRI brain, MRA head and neck, echocardiogram  Disposition: Assisted living Diet recommendation:  Discharge Diet Orders (From admission, onward)     Start     Ordered   12/31/21 0000  Diet - low sodium heart healthy        12/31/21 1542             DISCHARGE MEDICATION: Allergies as of 12/31/2021       Reactions   Doxycycline Hives, Rash   Benzonatate Other (See Comments)   Simvastatin Other (See Comments)   Tetanus Toxoid Other (See Comments)   Tetanus (horse-serum based tetanus)- unknown Other reaction(s): Unknown Tetanus (horse-serum based tetanus)- unknown Other reaction(s): Unknown Tetanus (horse-serum based tetanus)- unknown Other reaction(s): Unknown Tetanus (horse-serum based tetanus)- unknown   Codeine Nausea Only, Other (See Comments)   GI upset        Medication List     STOP taking these medications    cefUROXime 500 MG tablet Commonly known as: CEFTIN   citalopram 20 MG tablet Commonly known as: CELEXA   dipyridamole-aspirin 200-25 MG 12hr capsule Commonly known as: AGGRENOX   fluvastatin 40 MG capsule Commonly known as: LESCOL   MAGNESIUM CARBONATE  PO   omeprazole 40 MG capsule Commonly known as: PRILOSEC   predniSONE 10 MG tablet Commonly known as: DELTASONE       TAKE these medications    acetaminophen 650 MG CR tablet Commonly known as: TYLENOL Take 650 mg by mouth at bedtime.   ARIPiprazole 5 MG tablet Commonly known as: ABILIFY Take 5 mg by mouth daily.   aspirin EC 81 MG tablet Take 81 mg by mouth daily. Swallow whole.   atorvastatin 40 MG tablet Commonly known as: LIPITOR TAKE 1 TABLET BY MOUTH EVERY DAY   buPROPion 150 MG 24 hr tablet Commonly known as: WELLBUTRIN XL Take 150 mg by  mouth every morning.   calcium-vitamin D 500-5 MG-MCG tablet Commonly known as: OSCAL WITH D Take 1 tablet by mouth at bedtime.   cefadroxil 500 MG capsule Commonly known as: DURICEF Take 1 capsule (500 mg total) by mouth 2 (two) times daily.   clopidogrel 75 MG tablet Commonly known as: PLAVIX Take 1 tablet (75 mg total) by mouth daily. Start taking on: January 01, 2022   docusate sodium 100 MG capsule Commonly known as: COLACE Take 100 mg by mouth 2 (two) times daily.   fexofenadine 180 MG tablet Commonly known as: ALLEGRA fexofenadine 180 mg tablet  TAKE 1 TABLET (180 MG TOTAL) BY MOUTH ONCE DAILY. *OTC - NOT COVERED*   Influenza vac split quadrivalent PF 0.5 ML injection Commonly known as: FLUZONE HIGH-DOSE Fluzone High-Dose 2016-2017 (PF) 180 mcg/0.5 mL intramuscular syringe  TO BE ADMINISTERED BY PHARMACIST FOR IMMUNIZATION   losartan 50 MG tablet Commonly known as: COZAAR TAKE 1 TABLET BY MOUTH EVERYDAY AT BEDTIME   melatonin 3 MG Tabs tablet Take 5 mg by mouth at bedtime.   Myrbetriq 50 MG Tb24 tablet Generic drug: mirabegron ER Take 50 mg by mouth daily. What changed: Another medication with the same name was removed. Continue taking this medication, and follow the directions you see here.   polyethylene glycol 17 g packet Commonly known as: MIRALAX / GLYCOLAX Take 17 g by mouth daily as needed for moderate constipation.   PRESERVISION AREDS PO Take 2 capsules by mouth in the morning and at bedtime.   ProAir HFA 108 (90 Base) MCG/ACT inhaler Generic drug: albuterol ProAir HFA 90 mcg/actuation aerosol inhaler  INHALE 2 PUFFS INTO THE LUNGS EVERY 4 HOURS AS NEEDED FOR WHEEZING OR SHORTNESS OF BREATH   rOPINIRole 0.5 MG tablet Commonly known as: REQUIP Take 0.5 mg by mouth at bedtime.   sertraline 100 MG tablet Commonly known as: ZOLOFT Take 150 mg by mouth daily.   Systane 0.4-0.3 % Soln Generic drug: Polyethyl Glycol-Propyl Glycol Place 1 drop  into both eyes every 4 (four) hours as needed.   tamsulosin 0.4 MG Caps capsule Commonly known as: FLOMAX Take 0.4 mg by mouth daily.               Discharge Care Instructions  (From admission, onward)           Start     Ordered   12/31/21 0000  Discharge wound care:       Comments: Apply Medihoney to right anterior foot and right outer ankle and left anterior shin and cover with foam dressing.  Change dressing every 3 days or as needed for soiling   12/31/21 1542            Follow-up Information     Venia Carbon, MD Follow up.   Specialties: Internal Medicine,  Pediatrics Contact information: [place]  [id number] [phone]                 Discharge Instructions     Diet - low sodium heart healthy   Complete by: As directed    Discharge instructions   Complete by: As directed    From Dr. Loleta Books: You were admitted for unilateral weakness We suspect this was from a TIA You should take your normal statin (cholesterol medicine) and aspirin Add the "super aspirin" called clopidogrel Plavix 75 mg daily for 3 weeks, then stop While you are taking Plavix, do not take omeprazole/Prilosec.  (Use pepcid instead)  Go see Dr. Silvio Pate in 1 week   You may have had a mild UTI. Take cefadroxil 500 mg twice daily for the next 3 days then stop   Discharge wound care:   Complete by: As directed    Apply Medihoney to right anterior foot and right outer ankle and left anterior shin and cover with foam dressing.  Change dressing every 3 days or as needed for soiling   Increase activity slowly   Complete by: As directed        Discharge Exam: Filed Weights   12/30/21 1520  Weight: 71 kg    General: Pt is alert, awake, not in acute distress, very elderly, frail, hard of hearing, vision low Cardiovascular: RRR, nl S1-S2, no murmurs appreciated.   No LE edema.   Skin: There is a shallow ulcer on the left anterior shin, appears  to be pressure injury due to how he is laying, similar to his right ankle pressure injuries, with mild irritation around it but no cellulitis. Respiratory: Normal respiratory rate and rhythm.  CTAB without rales or wheezes. Abdominal: Abdomen soft and non-tender.  No distension or HSM.   Neuro/Psych: Sleeping but rouses to voice, oriented to self, place, year.  Strength symmetrically weak 4-/5 in all extremities, EOMI, speech fluent, mild psychomotor slowing.    Condition at discharge: fair  The results of significant diagnostics from this hospitalization (including imaging, microbiology, ancillary and laboratory) are listed below for reference.   Imaging Studies: US Venous Img Lower Unilateral Left  Result Date: 12/31/2021 CLINICAL DATA:  88 year old male with leg swelling EXAM: LEFT LOWER EXTREMITY VENOUS DOPPLER ULTRASOUND TECHNIQUE: Gray-scale sonography with graded compression, as well as color Doppler and duplex ultrasound were performed to evaluate the lower extremity deep venous systems from the level of the common femoral vein and including the common femoral, femoral, profunda femoral, popliteal and calf veins including the posterior tibial, peroneal and gastrocnemius veins when visible. The superficial great saphenous vein was also interrogated. Spectral Doppler was utilized to evaluate flow at rest and with distal augmentation maneuvers in the common femoral, femoral and popliteal veins. COMPARISON:  None Available. FINDINGS: Contralateral Common Femoral Vein: Respiratory phasicity is normal and symmetric with the symptomatic side. No evidence of thrombus. Normal compressibility. Common Femoral Vein: No evidence of thrombus. Normal compressibility, respiratory phasicity and response to augmentation. Saphenofemoral Junction: No evidence of thrombus. Normal compressibility and flow on color Doppler imaging. Profunda Femoral Vein: No evidence of thrombus. Normal compressibility and flow on  color Doppler imaging. Femoral Vein: No evidence of thrombus. Normal compressibility, respiratory phasicity and response to augmentation. Popliteal Vein: No evidence of thrombus. Normal compressibility, respiratory phasicity and response to augmentation. Calf Veins: No evidence of thrombus. Normal compressibility and flow on color Doppler imaging. Superficial Great Saphenous Vein: No evidence of thrombus. Normal  compressibility and flow on color Doppler imaging. Other Findings:  None. IMPRESSION: Directed duplex of the left lower extremity negative for DVT Signed, Dulcy Fanny. Nadene Rubins, RPVI [facility] Radiology Electronically Signed   By: Corrie Mckusick D.O.   On: 12/31/2021 09:58   MR BRAIN WO CONTRAST  Result Date: 12/31/2021 CLINICAL DATA:  Initial evaluation for neuro deficit, stroke suspected. EXAM: MRI HEAD WITHOUT CONTRAST MRA HEAD WITHOUT CONTRAST MRA NECK WITHOUT CONTRAST TECHNIQUE: Multiplanar, multiecho pulse sequences of the brain and surrounding structures were obtained without intravenous contrast. Angiographic images of the Circle of Willis were obtained using MRA technique without intravenous contrast. Angiographic images of the neck were obtained using MRA technique without intravenous contrast. Carotid stenosis measurements (when applicable) are obtained utilizing NASCET criteria, using the distal internal carotid diameter as the denominator. COMPARISON:  CT from 12/30/2021. FINDINGS: MRI HEAD FINDINGS Brain: Diffuse prominence of the CSF containing spaces compatible generalized age-related cerebral atrophy. Patchy and confluent T2/FLAIR hyperintensity involving the periventricular and deep white matter both cerebral hemispheres, most consistent with chronic small vessel ischemic disease, mild for age. No evidence for acute or subacute infarct. Gray-white matter differentiation maintained. No areas of chronic cortical infarction. No acute  intracranial hemorrhage. Single chronic microhemorrhage noted at the inferior left cerebellum, of doubtful significance in isolation. No mass lesion, midline shift or mass effect. Mild ventricular prominence related to global parenchymal volume loss without hydrocephalus. No extra-axial fluid collection. Pituitary gland and suprasellar region within normal limits. Vascular: Major intracranial vascular flow voids are maintained. Skull and upper cervical spine: Bone marrow signal intensity within normal limits. No scalp soft tissue abnormality. Craniocervical junction within normal limits. Sinuses/Orbits: Prior bilateral ocular lens replacement. Mild chronic mucosal thickening noted throughout the paranasal sinuses. No air-fluid levels to suggest acute sinusitis. No mastoid effusion. Other: None. MRA HEAD FINDINGS ANTERIOR CIRCULATION: Visualized distal cervical segments of the internal carotid arteries are patent with antegrade flow. Petrous, cavernous, and supraclinoid segments patent without hemodynamically significant stenosis. 2 mm outpouching extending inferiorly from the supraclinoid left ICA favored to reflect a small vascular infundibulum related to a hypoplastic left posterior communicating artery. A1 segments patent bilaterally. Normal anterior communicating artery complex. Both anterior cerebral arteries patent without stenosis. Right ACA dominant. No M1 stenosis or occlusion. No proximal MCA branch occlusion. Distal MCA branches perfused and symmetric. POSTERIOR CIRCULATION: Visualized distal V4 segments widely patent. Basilar widely patent to its distal aspect without stenosis. Superior cerebral arteries patent bilaterally. Both PCAs primarily supplied via the basilar. Left PCA widely patent to its distal aspect. Focal severe distal right P3 stenosis noted (series 1049, image 6). Right PCA otherwise widely patent as well. No intracranial aneurysm or other vascular malformation. MRA NECK FINDINGS AORTIC  ARCH: Aortic arch and origin of the great vessels not included or evaluated on this exam. RIGHT CAROTID SYSTEM: Visualized portions of the right CCA are widely patent. No significant atheromatous irregularity or narrowing about the right carotid bulb for patient age. Visualized right ICA patent without stenosis or dissection. LEFT CAROTID SYSTEM: Visualized left CCA widely patent. No significant atheromatous narrowing or irregularity about the left carotid bulb for patient age. Visualized left ICA widely patent without stenosis or dissection. VERTEBRAL ARTERIES: Codominant. Neither vertebral artery origin visualized. Visualized portions patent without stenosis or dissection. IMPRESSION: MRI HEAD: 1. No acute intracranial abnormality. 2. Generalized age-related cerebral atrophy with mild chronic small vessel ischemic disease. MRA HEAD: 1. Negative intracranial MRA for large vessel occlusion  or other emergent finding. 2. Focal severe distal right P3 stenosis. 3. Otherwise wide patency of the major intracranial arterial circulation. No other hemodynamically significant or correctable stenosis. MRA NECK: Normal MRA of the neck. Electronically Signed   By: Jeannine Boga M.D.   On: 12/31/2021 02:50   MR ANGIO HEAD WO CONTRAST  Result Date: 12/31/2021 CLINICAL DATA:  Initial evaluation for neuro deficit, stroke suspected. EXAM: MRI HEAD WITHOUT CONTRAST MRA HEAD WITHOUT CONTRAST MRA NECK WITHOUT CONTRAST TECHNIQUE: Multiplanar, multiecho pulse sequences of the brain and surrounding structures were obtained without intravenous contrast. Angiographic images of the Circle of Willis were obtained using MRA technique without intravenous contrast. Angiographic images of the neck were obtained using MRA technique without intravenous contrast. Carotid stenosis measurements (when applicable) are obtained utilizing NASCET criteria, using the distal internal carotid diameter as the denominator. COMPARISON:  CT from  12/30/2021. FINDINGS: MRI HEAD FINDINGS Brain: Diffuse prominence of the CSF containing spaces compatible generalized age-related cerebral atrophy. Patchy and confluent T2/FLAIR hyperintensity involving the periventricular and deep white matter both cerebral hemispheres, most consistent with chronic small vessel ischemic disease, mild for age. No evidence for acute or subacute infarct. Gray-white matter differentiation maintained. No areas of chronic cortical infarction. No acute intracranial hemorrhage. Single chronic microhemorrhage noted at the inferior left cerebellum, of doubtful significance in isolation. No mass lesion, midline shift or mass effect. Mild ventricular prominence related to global parenchymal volume loss without hydrocephalus. No extra-axial fluid collection. Pituitary gland and suprasellar region within normal limits. Vascular: Major intracranial vascular flow voids are maintained. Skull and upper cervical spine: Bone marrow signal intensity within normal limits. No scalp soft tissue abnormality. Craniocervical junction within normal limits. Sinuses/Orbits: Prior bilateral ocular lens replacement. Mild chronic mucosal thickening noted throughout the paranasal sinuses. No air-fluid levels to suggest acute sinusitis. No mastoid effusion. Other: None. MRA HEAD FINDINGS ANTERIOR CIRCULATION: Visualized distal cervical segments of the internal carotid arteries are patent with antegrade flow. Petrous, cavernous, and supraclinoid segments patent without hemodynamically significant stenosis. 2 mm outpouching extending inferiorly from the supraclinoid left ICA favored to reflect a small vascular infundibulum related to a hypoplastic left posterior communicating artery. A1 segments patent bilaterally. Normal anterior communicating artery complex. Both anterior cerebral arteries patent without stenosis. Right ACA dominant. No M1 stenosis or occlusion. No proximal MCA branch occlusion. Distal MCA branches  perfused and symmetric. POSTERIOR CIRCULATION: Visualized distal V4 segments widely patent. Basilar widely patent to its distal aspect without stenosis. Superior cerebral arteries patent bilaterally. Both PCAs primarily supplied via the basilar. Left PCA widely patent to its distal aspect. Focal severe distal right P3 stenosis noted (series 1049, image 6). Right PCA otherwise widely patent as well. No intracranial aneurysm or other vascular malformation. MRA NECK FINDINGS AORTIC ARCH: Aortic arch and origin of the great vessels not included or evaluated on this exam. RIGHT CAROTID SYSTEM: Visualized portions of the right CCA are widely patent. No significant atheromatous irregularity or narrowing about the right carotid bulb for patient age. Visualized right ICA patent without stenosis or dissection. LEFT CAROTID SYSTEM: Visualized left CCA widely patent. No significant atheromatous narrowing or irregularity about the left carotid bulb for patient age. Visualized left ICA widely patent without stenosis or dissection. VERTEBRAL ARTERIES: Codominant. Neither vertebral artery origin visualized. Visualized portions patent without stenosis or dissection. IMPRESSION: MRI HEAD: 1. No acute intracranial abnormality. 2. Generalized age-related cerebral atrophy with mild chronic small vessel ischemic disease. MRA HEAD: 1. Negative intracranial MRA for large vessel occlusion  or other emergent finding. 2. Focal severe distal right P3 stenosis. 3. Otherwise wide patency of the major intracranial arterial circulation. No other hemodynamically significant or correctable stenosis. MRA NECK: Normal MRA of the neck. Electronically Signed   By: Jeannine Boga M.D.   On: 12/31/2021 02:50   MR ANGIO NECK WO CONTRAST  Result Date: 12/31/2021 CLINICAL DATA:  Initial evaluation for neuro deficit, stroke suspected. EXAM: MRI HEAD WITHOUT CONTRAST MRA HEAD WITHOUT CONTRAST MRA NECK WITHOUT CONTRAST TECHNIQUE: Multiplanar, multiecho  pulse sequences of the brain and surrounding structures were obtained without intravenous contrast. Angiographic images of the Circle of Willis were obtained using MRA technique without intravenous contrast. Angiographic images of the neck were obtained using MRA technique without intravenous contrast. Carotid stenosis measurements (when applicable) are obtained utilizing NASCET criteria, using the distal internal carotid diameter as the denominator. COMPARISON:  CT from 12/30/2021. FINDINGS: MRI HEAD FINDINGS Brain: Diffuse prominence of the CSF containing spaces compatible generalized age-related cerebral atrophy. Patchy and confluent T2/FLAIR hyperintensity involving the periventricular and deep white matter both cerebral hemispheres, most consistent with chronic small vessel ischemic disease, mild for age. No evidence for acute or subacute infarct. Gray-white matter differentiation maintained. No areas of chronic cortical infarction. No acute intracranial hemorrhage. Single chronic microhemorrhage noted at the inferior left cerebellum, of doubtful significance in isolation. No mass lesion, midline shift or mass effect. Mild ventricular prominence related to global parenchymal volume loss without hydrocephalus. No extra-axial fluid collection. Pituitary gland and suprasellar region within normal limits. Vascular: Major intracranial vascular flow voids are maintained. Skull and upper cervical spine: Bone marrow signal intensity within normal limits. No scalp soft tissue abnormality. Craniocervical junction within normal limits. Sinuses/Orbits: Prior bilateral ocular lens replacement. Mild chronic mucosal thickening noted throughout the paranasal sinuses. No air-fluid levels to suggest acute sinusitis. No mastoid effusion. Other: None. MRA HEAD FINDINGS ANTERIOR CIRCULATION: Visualized distal cervical segments of the internal carotid arteries are patent with antegrade flow. Petrous, cavernous, and supraclinoid  segments patent without hemodynamically significant stenosis. 2 mm outpouching extending inferiorly from the supraclinoid left ICA favored to reflect a small vascular infundibulum related to a hypoplastic left posterior communicating artery. A1 segments patent bilaterally. Normal anterior communicating artery complex. Both anterior cerebral arteries patent without stenosis. Right ACA dominant. No M1 stenosis or occlusion. No proximal MCA branch occlusion. Distal MCA branches perfused and symmetric. POSTERIOR CIRCULATION: Visualized distal V4 segments widely patent. Basilar widely patent to its distal aspect without stenosis. Superior cerebral arteries patent bilaterally. Both PCAs primarily supplied via the basilar. Left PCA widely patent to its distal aspect. Focal severe distal right P3 stenosis noted (series 1049, image 6). Right PCA otherwise widely patent as well. No intracranial aneurysm or other vascular malformation. MRA NECK FINDINGS AORTIC ARCH: Aortic arch and origin of the great vessels not included or evaluated on this exam. RIGHT CAROTID SYSTEM: Visualized portions of the right CCA are widely patent. No significant atheromatous irregularity or narrowing about the right carotid bulb for patient age. Visualized right ICA patent without stenosis or dissection. LEFT CAROTID SYSTEM: Visualized left CCA widely patent. No significant atheromatous narrowing or irregularity about the left carotid bulb for patient age. Visualized left ICA widely patent without stenosis or dissection. VERTEBRAL ARTERIES: Codominant. Neither vertebral artery origin visualized. Visualized portions patent without stenosis or dissection. IMPRESSION: MRI HEAD: 1. No acute intracranial abnormality. 2. Generalized age-related cerebral atrophy with mild chronic small vessel ischemic disease. MRA HEAD: 1. Negative intracranial MRA for large vessel occlusion  or other emergent finding. 2. Focal severe distal right P3 stenosis. 3. Otherwise  wide patency of the major intracranial arterial circulation. No other hemodynamically significant or correctable stenosis. MRA NECK: Normal MRA of the neck. Electronically Signed   By: Jeannine Boga M.D.   On: 12/31/2021 02:50   DG Chest Port 1 View  Result Date: 12/30/2021 CLINICAL DATA:  Left-sided weakness EXAM: PORTABLE CHEST 1 VIEW COMPARISON:  03/30/2020 FINDINGS: Cardiac size is within normal limits. There are no signs of pulmonary edema or new focal infiltrates. Calcified granulomas are seen in left lower lung field. There is no pleural effusion or pneumothorax. Deformity in proximal left humerus may be residual from previous injury. Degenerative changes are noted in left shoulder. IMPRESSION: There are no signs of pulmonary edema or new focal infiltrates. Electronically Signed   By: Elmer Picker M.D.   On: 12/30/2021 17:20   CT HEAD CODE STROKE WO CONTRAST  Result Date: 12/30/2021 CLINICAL DATA:  Code stroke. Acute onset of left upper extremity weakness and blurred vision. Left-sided weakness and facial droop. Last known well at 9 p.m. last night. EXAM: CT HEAD WITHOUT CONTRAST TECHNIQUE: Contiguous axial images were obtained from the base of the skull through the vertex without intravenous contrast. RADIATION DOSE REDUCTION: This exam was performed according to the departmental dose-optimization program which includes automated exposure control, adjustment of the mA and/or kV according to patient size and/or use of iterative reconstruction technique. COMPARISON:  CT head without contrast 03/30/2020 FINDINGS: Brain: No acute infarct, hemorrhage, or mass lesion is present. Basal ganglia are intact. Insular ribbon is normal. No acute or focal cortical abnormality is present. Moderate generalized atrophy and white matter disease is present. The ventricles are proportionate to the degree of atrophy. No significant extraaxial fluid collection is present. No significant interval change is  present. Vascular: Atherosclerotic calcifications are present within the cavernous internal carotid arteries bilaterally. No hyperdense vessel is present. Skull: Calvarium is intact. No focal lytic or blastic lesions are present. No significant extracranial soft tissue lesion is present. Sinuses/Orbits: The paranasal sinuses and mastoid air cells are clear. Bilateral lens replacements are noted. Globes and orbits are otherwise unremarkable. ASPECTS [facility] Stroke Program Early CT Score) - Ganglionic level infarction (caudate, lentiform nuclei, internal capsule, insula, M1-M3 cortex): 7/7 - Supraganglionic infarction (M4-M6 cortex): 3/3 Total score (0-10 with 10 being normal): 10/10 IMPRESSION: 1. No acute intracranial abnormality or significant interval change. 2. Stable moderate generalized atrophy and white matter disease. This likely reflects the sequela of chronic microvascular ischemia. The above was relayed via text pager to Dr. Leonel Ramsay on 12/30/2021 at 15:12 . Electronically Signed   By: San Morelle M.D.   On: 12/30/2021 15:12    Microbiology: Results for orders placed or performed during the hospital encounter of 12/30/21  MRSA Next Gen by PCR, Nasal     Status: None   Collection Time: 12/30/21  6:25 PM   Specimen: Nasal Mucosa; Nasal Swab  Result Value Ref Range Status   MRSA by PCR Next Gen NOT DETECTED NOT DETECTED Final    Comment: (NOTE) The GeneXpert MRSA Assay (FDA approved for NASAL specimens only), is one component of a comprehensive MRSA colonization surveillance program. It is not intended to diagnose MRSA infection nor to guide or monitor treatment for MRSA infections. Test performance is not FDA approved in patients less than 10 years old. Performed at [facility], [address] [place], [place] [address]     Labs: CBC: Recent  Labs  Lab 12/30/21 1515  WBC 15.4*  NEUTROABS 11.2*  HGB 10.4*  HCT 33.0*  MCV 90.9  PLT 470*   Basic Metabolic  Panel: Recent Labs  Lab 12/30/21 1509 12/30/21 1515 12/31/21 0735  NA  --  135 137  K  --  4.1 3.9  CL  --  103 108  CO2  --  23 21*  GLUCOSE  --  103* 102*  BUN  --  36* 35*  CREATININE 2.10* 1.80* 1.62*  CALCIUM  --  8.7* 8.5*   Liver Function Tests: Recent Labs  Lab 12/30/21 1515  AST 28  ALT 24  ALKPHOS 60  BILITOT 0.6  PROT 6.6  ALBUMIN 3.0*   CBG: Recent Labs  Lab 12/30/21 1449  GLUCAP 96    Discharge time spent: approximately 35 minutes spent on discharge counseling, evaluation of patient on day of discharge, and coordination of discharge planning with nursing, social work, pharmacy and case management  Signed: Edwin Dada, MD [facility] 12/31/2021

## 2022-01-01 LAB — ECHOCARDIOGRAM COMPLETE
AR max vel: 2.47 cm2
AV Area VTI: 2.68 cm2
AV Area mean vel: 2.01 cm2
AV Mean grad: 2 mmHg
AV Peak grad: 3.2 mmHg
Ao pk vel: 0.89 m/s
Area-P 1/2: 3.45 cm2
Height: 70 in
S' Lateral: 1.9 cm
Weight: 2504.43 oz

## 2022-01-10 DIAGNOSIS — R319 Hematuria, unspecified: Secondary | ICD-10-CM | POA: Diagnosis not present

## 2022-01-19 ENCOUNTER — Encounter: Payer: Self-pay | Admitting: Student

## 2022-01-19 ENCOUNTER — Non-Acute Institutional Stay (SKILLED_NURSING_FACILITY): Payer: Medicare Other | Admitting: Student

## 2022-01-19 DIAGNOSIS — N3281 Overactive bladder: Secondary | ICD-10-CM

## 2022-01-19 DIAGNOSIS — I10 Essential (primary) hypertension: Secondary | ICD-10-CM

## 2022-01-19 DIAGNOSIS — G2581 Restless legs syndrome: Secondary | ICD-10-CM | POA: Diagnosis not present

## 2022-01-19 DIAGNOSIS — F01B4 Vascular dementia, moderate, with anxiety: Secondary | ICD-10-CM

## 2022-01-19 DIAGNOSIS — F418 Other specified anxiety disorders: Secondary | ICD-10-CM

## 2022-01-19 DIAGNOSIS — N1832 Chronic kidney disease, stage 3b: Secondary | ICD-10-CM | POA: Diagnosis not present

## 2022-01-19 DIAGNOSIS — Z8673 Personal history of transient ischemic attack (TIA), and cerebral infarction without residual deficits: Secondary | ICD-10-CM

## 2022-01-19 DIAGNOSIS — C61 Malignant neoplasm of prostate: Secondary | ICD-10-CM | POA: Insufficient documentation

## 2022-01-19 DIAGNOSIS — F319 Bipolar disorder, unspecified: Secondary | ICD-10-CM

## 2022-01-19 NOTE — Progress Notes (Signed)
Location:   [facility])   Place of Service:  Nursing [phone]) Provider:  Dewayne Shorter, MD  Dewayne Shorter, MD  Patient Care Team: Dewayne Shorter, MD as PCP - General (Family Medicine)  Extended Emergency Contact Information Primary Emergency Contact: Evan Cole Mobile Phone: [phone] Relation: Daughter Secondary Emergency Contact: Evan Cole Address: [address]          [place], [place] [address] [place] Phone: [phone] Relation: Spouse  Code Status:  DNAR Goals of care: Advanced Directive information    12/30/2021    6:01 PM  Advanced Directives  Does Patient Have a Medical Advance Directive? Yes  Type of Advance Directive [place]  Does patient want to make changes to medical advance directive? No - Patient declined  Copy of [place] in Chart? No - copy requested     No chief complaint on file.   HPI:  Pt is a 88 y.o. male seen today for an acute visit for due to concern for increased somnolence. Patient has a history of TIA and was found slouched in his room this morning. Upon evaluation, patient is sleepy, but arousal. Upon arousal patient is oriented to self, location, and situation. He is able to state his name and date of birth and stat his wife is Evan Cole and his daughter Evan Cole is his POA. 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 He states his favorite place to fly was in [place]. He was impressed I was aware that means his primary language is [place] which he affirmed.   Called his daughter Evan Cole to inform of the situation. She was relieved there were no acute concerns. Discussed potential medications that could be leading to increased daytime sleepiness including Abilify, sertraline, ropinirole, and other anticholinergic medications. Given no acute signs of stroke, she deferred these changes at this time. Will continue to discuss at follow up.     Past Medical History:  Diagnosis Date   Frequent falls    Hypertension    Shoulder dislocation    TIA (transient ischemic attack)    Past Surgical History:  Procedure Laterality Date   APPENDECTOMY     HERNIA REPAIR      Allergies  Allergen Reactions   Doxycycline Hives and Rash   Benzonatate Other (See Comments)   Simvastatin Other (See Comments)   Tetanus Toxoid Other (See Comments)    Tetanus (horse-serum based tetanus)- unknown  Other reaction(s): Unknown Tetanus (horse-serum based tetanus)- unknown  Other reaction(s): Unknown Tetanus (horse-serum based tetanus)- unknown Other reaction(s): Unknown Tetanus (horse-serum based tetanus)- unknown    Codeine Nausea Only and Other (See Comments)    GI upset     Outpatient Encounter Medications as of 01/19/2022  Medication Sig   acetaminophen (TYLENOL) 650 MG CR tablet Take 650 mg by mouth at bedtime.   ARIPiprazole (ABILIFY) 5 MG tablet Take 5 mg by mouth daily.   aspirin EC 81 MG tablet Take 81 mg by mouth daily. Swallow whole.   atorvastatin (LIPITOR) 40 MG tablet TAKE 1 TABLET BY MOUTH EVERY DAY   buPROPion (WELLBUTRIN XL) 150 MG 24 hr tablet Take 150 mg by mouth every morning.   calcium-vitamin D (OSCAL WITH D) 500-5 MG-MCG tablet Take 1 tablet by mouth at bedtime.   clopidogrel (PLAVIX) 75 MG tablet Take 1 tablet (75 mg total) by mouth daily.   docusate sodium (COLACE) 100 MG capsule Take 100 mg by  mouth 2 (two) times daily.   fexofenadine (ALLEGRA) 180 MG tablet fexofenadine 180 mg tablet  TAKE 1 TABLET (180 MG TOTAL) BY MOUTH ONCE DAILY. *OTC - NOT COVERED*   losartan (COZAAR) 50 MG tablet TAKE 1 TABLET BY MOUTH EVERYDAY AT BEDTIME   Melatonin 3 MG TABS Take 5 mg by mouth at bedtime.   Multiple Vitamins-Minerals (PRESERVISION AREDS PO) Take 2 capsules by mouth in the morning and at bedtime.   MYRBETRIQ 50 MG TB24 tablet Take 50 mg by mouth daily.   ondansetron (ZOFRAN-ODT) 4 MG disintegrating tablet Take 4  mg by mouth every 8 (eight) hours as needed for nausea or vomiting.   Polyethyl Glycol-Propyl Glycol (SYSTANE) 0.4-0.3 % SOLN Place 1 drop into both eyes every 4 (four) hours as needed.   polyethylene glycol (MIRALAX / GLYCOLAX) 17 g packet Take 17 g by mouth daily as needed for moderate constipation.   rOPINIRole (REQUIP) 0.5 MG tablet Take 0.5 mg by mouth at bedtime.   sertraline (ZOLOFT) 100 MG tablet Take 150 mg by mouth daily.   [DISCONTINUED] albuterol (PROAIR HFA) 108 (90 Base) MCG/ACT inhaler ProAir HFA 90 mcg/actuation aerosol inhaler  INHALE 2 PUFFS INTO THE LUNGS EVERY 4 HOURS AS NEEDED FOR WHEEZING OR SHORTNESS OF BREATH (Patient not taking: Reported on 12/30/2021)   [DISCONTINUED] cefadroxil (DURICEF) 500 MG capsule Take 1 capsule (500 mg total) by mouth 2 (two) times daily.   [DISCONTINUED] hydrochlorothiazide (HYDRODIURIL) 25 MG tablet hydrochlorothiazide 25 mg tablet   [DISCONTINUED] Influenza vac split quadrivalent PF (FLUZONE HIGH-DOSE) 0.5 ML injection Fluzone High-Dose 2016-2017 (PF) 180 mcg/0.5 mL intramuscular syringe  TO BE ADMINISTERED BY PHARMACIST FOR IMMUNIZATION   [DISCONTINUED] tamsulosin (FLOMAX) 0.4 MG CAPS capsule Take 0.4 mg by mouth daily. (Patient not taking: Reported on 01/19/2022)   No facility-administered encounter medications on file as of 01/19/2022.    Review of Systems  Constitutional:  Positive for activity change.       Increased daytime sleepiness  HENT:         Denies headaches, changes in vision  Respiratory:  Negative for chest tightness and shortness of breath.   Gastrointestinal:  Negative for abdominal distention and constipation.  Endocrine: Negative for cold intolerance.  Genitourinary:  Negative for difficulty urinating.  Musculoskeletal:  Negative for arthralgias.  Psychiatric/Behavioral:  Negative for confusion.     Immunization History  Administered Date(s) Administered   [employer] Vaccination 06/10/2019   Pertinent   Health Maintenance Due  Topic Date Due   INFLUENZA VACCINE  12/21/2021      12/30/2021    3:21 PM 12/30/2021    6:11 PM 12/31/2021   12:00 AM 12/31/2021   11:00 AM 12/31/2021    8:03 PM  Fall Risk  Patient Fall Risk Level Low fall risk High fall risk High fall risk High fall risk High fall risk   Functional Status Survey:    Vitals:   01/19/22 2219  BP: 138/72  Pulse: 88  Resp: 18  Temp: 97.6 F (36.4 C)  SpO2: 97%  Weight: 160 lb (72.6 kg)   Body mass index is 22.96 kg/m. Physical Exam Constitutional:      Comments: Sleepy but arousable and conversational when alert.   Cardiovascular:     Rate and Rhythm: Normal rate and regular rhythm.     Pulses: Normal pulses.     Heart sounds: Normal heart sounds.  Pulmonary:     Effort: Pulmonary effort is normal.     Breath sounds:  Normal breath sounds.  Skin:    General: Skin is warm and dry.  Neurological:     Mental Status: He is oriented to person, place, and time.     Comments: 4/5 strength in all extremities. Cranial nerves intact.      Labs reviewed: Recent Labs    12/30/21 1509 12/30/21 1515 12/31/21 0735  NA  --  135 137  K  --  4.1 3.9  CL  --  103 108  CO2  --  23 21*  GLUCOSE  --  103* 102*  BUN  --  36* 35*  CREATININE 2.10* 1.80* 1.62*  CALCIUM  --  8.7* 8.5*   Recent Labs    12/30/21 1515  AST 28  ALT 24  ALKPHOS 60  BILITOT 0.6  PROT 6.6  ALBUMIN 3.0*   Recent Labs    12/30/21 1515  WBC 15.4*  NEUTROABS 11.2*  HGB 10.4*  HCT 33.0*  MCV 90.9  PLT 521*   No results found for: "TSH" Lab Results  Component Value Date   HGBA1C 5.3 12/30/2021   Lab Results  Component Value Date   CHOL 107 12/31/2021   HDL 26 (L) 12/31/2021   LDLCALC 57 12/31/2021   TRIG 122 12/31/2021   CHOLHDL 4.1 12/31/2021    Significant Diagnostic Results in last 30 days:  ECHOCARDIOGRAM COMPLETE  Result Date: 01/01/2022    ECHOCARDIOGRAM REPORT   Patient Name:   Evan Cole. Date of Exam:  12/31/2021 Medical Rec #:  [id number]          Height:       70.0 in Accession #:    [id number]         Weight:       156.5 lb Date of Birth:  1919-07-03          BSA:          1.881 m Patient Age:    80 years           BP:           140/71 mmHg Patient Gender: M                  HR:           72 bpm. Exam Location:  [facility] Procedure: 2D Echo, Cardiac Doppler, Color Doppler and Intracardiac            Opacification Agent Indications:     Stroke I63.9  History:         Patient has no prior history of Echocardiogram examinations.                  TIA; Risk Factors:Hypertension.  Sonographer:     Sherrie Sport Referring Phys:  [id number] Suann Larry DANFORD Diagnosing Phys: Serafina Royals MD  Sonographer Comments: Technically difficult study due to poor echo windows, no parasternal window and no subcostal window. Image acquisition challenging due to patient body habitus and Patient has significant pectus excavatum. IMPRESSIONS  1. Left ventricular ejection fraction, by estimation, is 65 to 70%. The left ventricle has normal function. The left ventricle has no regional wall motion abnormalities. Left ventricular diastolic function could not be evaluated.  2. Right ventricular systolic function is normal. The right ventricular size is not well visualized.  3. The mitral valve was not well visualized. No evidence of mitral valve regurgitation.  4. The aortic valve was not well visualized. Aortic valve regurgitation is not visualized. FINDINGS  Left Ventricle: Left ventricular ejection fraction, by estimation, is 65 to 70%. The left ventricle has normal function. The left ventricle has no regional wall motion abnormalities. Definity contrast agent was given IV to delineate the left ventricular  endocardial borders. The left ventricular internal cavity size was normal in size. Suboptimal image quality limits for assessment of left ventricular hypertrophy. Left ventricular diastolic function could not be evaluated. Right Ventricle:  The right ventricular size is not well visualized. Right vetricular wall thickness was not assessed. Right ventricular systolic function is normal. Left Atrium: Left atrial size was normal in size. Right Atrium: Right atrial size was normal in size. Pericardium: There is no evidence of pericardial effusion. Mitral Valve: The mitral valve was not well visualized. No evidence of mitral valve regurgitation. Tricuspid Valve: The tricuspid valve is not well visualized. Tricuspid valve regurgitation is not demonstrated. Aortic Valve: The aortic valve was not well visualized. Aortic valve regurgitation is not visualized. Aortic valve mean gradient measures 2.0 mmHg. Aortic valve peak gradient measures 3.2 mmHg. Aortic valve area, by VTI measures 2.68 cm. Pulmonic Valve: The pulmonic valve was not well visualized. Pulmonic valve regurgitation is not visualized. Aorta: The aortic root and ascending aorta are structurally normal, with no evidence of dilitation. IAS/Shunts: The interatrial septum was not assessed.  LEFT VENTRICLE PLAX 2D LVIDd:         2.80 cm   Diastology LVIDs:         1.90 cm   LV e' medial:    5.55 cm/s LV PW:         1.00 cm   LV E/e' medial:  10.8 LV IVS:        0.80 cm   LV e' lateral:   10.40 cm/s LVOT diam:     2.00 cm   LV E/e' lateral: 5.8 LV SV:         43 LV SV Index:   23 LVOT Area:     3.14 cm  RIGHT VENTRICLE RV Basal diam:  3.70 cm RV S prime:     10.02 cm/s TAPSE (M-mode): 2.4 cm LEFT ATRIUM             Index        RIGHT ATRIUM           Index LA diam:        2.40 cm 1.28 cm/m   RA Area:     14.50 cm LA Vol (A2C):   18.9 ml 10.05 ml/m  RA Volume:   39.30 ml  20.89 ml/m LA Vol (A4C):   16.7 ml 8.85 ml/m LA Biplane Vol: 19.2 ml 10.21 ml/m  AORTIC VALVE AV Area (Vmax):    2.47 cm AV Area (Vmean):   2.01 cm AV Area (VTI):     2.68 cm AV Vmax:           88.90 cm/s AV Vmean:          62.000 cm/s AV VTI:            0.162 m AV Peak Grad:      3.2 mmHg AV Mean Grad:      2.0 mmHg LVOT  Vmax:         69.80 cm/s LVOT Vmean:        39.600 cm/s LVOT VTI:          0.138 m LVOT/AV VTI ratio: 0.85 MITRAL VALVE  TRICUSPID VALVE MV Area (PHT): 3.45 cm    TR Peak grad:   8.6 mmHg MV Decel Time: 220 msec    TR Vmax:        147.00 cm/s MV E velocity: 60.00 cm/s MV A velocity: 73.30 cm/s  SHUNTS MV E/A ratio:  0.82        Systemic VTI:  0.14 m                            Systemic Diam: 2.00 cm Serafina Royals MD Electronically signed by Serafina Royals MD Signature Date/Time: 01/01/2022/11:36:18 AM    Final    US Venous Img Lower Unilateral Left  Result Date: 12/31/2021 CLINICAL DATA:  88 year old male with leg swelling EXAM: LEFT LOWER EXTREMITY VENOUS DOPPLER ULTRASOUND TECHNIQUE: Gray-scale sonography with graded compression, as well as color Doppler and duplex ultrasound were performed to evaluate the lower extremity deep venous systems from the level of the common femoral vein and including the common femoral, femoral, profunda femoral, popliteal and calf veins including the posterior tibial, peroneal and gastrocnemius veins when visible. The superficial great saphenous vein was also interrogated. Spectral Doppler was utilized to evaluate flow at rest and with distal augmentation maneuvers in the common femoral, femoral and popliteal veins. COMPARISON:  None Available. FINDINGS: Contralateral Common Femoral Vein: Respiratory phasicity is normal and symmetric with the symptomatic side. No evidence of thrombus. Normal compressibility. Common Femoral Vein: No evidence of thrombus. Normal compressibility, respiratory phasicity and response to augmentation. Saphenofemoral Junction: No evidence of thrombus. Normal compressibility and flow on color Doppler imaging. Profunda Femoral Vein: No evidence of thrombus. Normal compressibility and flow on color Doppler imaging. Femoral Vein: No evidence of thrombus. Normal compressibility, respiratory phasicity and response to augmentation. Popliteal Vein:  No evidence of thrombus. Normal compressibility, respiratory phasicity and response to augmentation. Calf Veins: No evidence of thrombus. Normal compressibility and flow on color Doppler imaging. Superficial Great Saphenous Vein: No evidence of thrombus. Normal compressibility and flow on color Doppler imaging. Other Findings:  None. IMPRESSION: Directed duplex of the left lower extremity negative for DVT Signed, Dulcy Fanny. Nadene Rubins, RPVI [facility] Radiology Electronically Signed   By: Corrie Mckusick D.O.   On: 12/31/2021 09:58   MR BRAIN WO CONTRAST  Result Date: 12/31/2021 CLINICAL DATA:  Initial evaluation for neuro deficit, stroke suspected. EXAM: MRI HEAD WITHOUT CONTRAST MRA HEAD WITHOUT CONTRAST MRA NECK WITHOUT CONTRAST TECHNIQUE: Multiplanar, multiecho pulse sequences of the brain and surrounding structures were obtained without intravenous contrast. Angiographic images of the Circle of Willis were obtained using MRA technique without intravenous contrast. Angiographic images of the neck were obtained using MRA technique without intravenous contrast. Carotid stenosis measurements (when applicable) are obtained utilizing NASCET criteria, using the distal internal carotid diameter as the denominator. COMPARISON:  CT from 12/30/2021. FINDINGS: MRI HEAD FINDINGS Brain: Diffuse prominence of the CSF containing spaces compatible generalized age-related cerebral atrophy. Patchy and confluent T2/FLAIR hyperintensity involving the periventricular and deep white matter both cerebral hemispheres, most consistent with chronic small vessel ischemic disease, mild for age. No evidence for acute or subacute infarct. Gray-white matter differentiation maintained. No areas of chronic cortical infarction. No acute intracranial hemorrhage. Single chronic microhemorrhage noted at the inferior left cerebellum, of doubtful significance in isolation. No mass lesion,  midline shift or mass effect. Mild ventricular prominence related to global parenchymal volume loss without hydrocephalus. No extra-axial fluid collection. Pituitary  gland and suprasellar region within normal limits. Vascular: Major intracranial vascular flow voids are maintained. Skull and upper cervical spine: Bone marrow signal intensity within normal limits. No scalp soft tissue abnormality. Craniocervical junction within normal limits. Sinuses/Orbits: Prior bilateral ocular lens replacement. Mild chronic mucosal thickening noted throughout the paranasal sinuses. No air-fluid levels to suggest acute sinusitis. No mastoid effusion. Other: None. MRA HEAD FINDINGS ANTERIOR CIRCULATION: Visualized distal cervical segments of the internal carotid arteries are patent with antegrade flow. Petrous, cavernous, and supraclinoid segments patent without hemodynamically significant stenosis. 2 mm outpouching extending inferiorly from the supraclinoid left ICA favored to reflect a small vascular infundibulum related to a hypoplastic left posterior communicating artery. A1 segments patent bilaterally. Normal anterior communicating artery complex. Both anterior cerebral arteries patent without stenosis. Right ACA dominant. No M1 stenosis or occlusion. No proximal MCA branch occlusion. Distal MCA branches perfused and symmetric. POSTERIOR CIRCULATION: Visualized distal V4 segments widely patent. Basilar widely patent to its distal aspect without stenosis. Superior cerebral arteries patent bilaterally. Both PCAs primarily supplied via the basilar. Left PCA widely patent to its distal aspect. Focal severe distal right P3 stenosis noted (series 1049, image 6). Right PCA otherwise widely patent as well. No intracranial aneurysm or other vascular malformation. MRA NECK FINDINGS AORTIC ARCH: Aortic arch and origin of the great vessels not included or evaluated on this exam. RIGHT CAROTID SYSTEM: Visualized portions of the right CCA  are widely patent. No significant atheromatous irregularity or narrowing about the right carotid bulb for patient age. Visualized right ICA patent without stenosis or dissection. LEFT CAROTID SYSTEM: Visualized left CCA widely patent. No significant atheromatous narrowing or irregularity about the left carotid bulb for patient age. Visualized left ICA widely patent without stenosis or dissection. VERTEBRAL ARTERIES: Codominant. Neither vertebral artery origin visualized. Visualized portions patent without stenosis or dissection. IMPRESSION: MRI HEAD: 1. No acute intracranial abnormality. 2. Generalized age-related cerebral atrophy with mild chronic small vessel ischemic disease. MRA HEAD: 1. Negative intracranial MRA for large vessel occlusion or other emergent finding. 2. Focal severe distal right P3 stenosis. 3. Otherwise wide patency of the major intracranial arterial circulation. No other hemodynamically significant or correctable stenosis. MRA NECK: Normal MRA of the neck. Electronically Signed   By: Jeannine Boga M.D.   On: 12/31/2021 02:50   MR ANGIO HEAD WO CONTRAST  Result Date: 12/31/2021 CLINICAL DATA:  Initial evaluation for neuro deficit, stroke suspected. EXAM: MRI HEAD WITHOUT CONTRAST MRA HEAD WITHOUT CONTRAST MRA NECK WITHOUT CONTRAST TECHNIQUE: Multiplanar, multiecho pulse sequences of the brain and surrounding structures were obtained without intravenous contrast. Angiographic images of the Circle of Willis were obtained using MRA technique without intravenous contrast. Angiographic images of the neck were obtained using MRA technique without intravenous contrast. Carotid stenosis measurements (when applicable) are obtained utilizing NASCET criteria, using the distal internal carotid diameter as the denominator. COMPARISON:  CT from 12/30/2021. FINDINGS: MRI HEAD FINDINGS Brain: Diffuse prominence of the CSF containing spaces compatible generalized age-related cerebral atrophy. Patchy  and confluent T2/FLAIR hyperintensity involving the periventricular and deep white matter both cerebral hemispheres, most consistent with chronic small vessel ischemic disease, mild for age. No evidence for acute or subacute infarct. Gray-white matter differentiation maintained. No areas of chronic cortical infarction. No acute intracranial hemorrhage. Single chronic microhemorrhage noted at the inferior left cerebellum, of doubtful significance in isolation. No mass lesion, midline shift or mass effect. Mild ventricular prominence related to global parenchymal volume loss without hydrocephalus. No extra-axial fluid collection. Pituitary  gland and suprasellar region within normal limits. Vascular: Major intracranial vascular flow voids are maintained. Skull and upper cervical spine: Bone marrow signal intensity within normal limits. No scalp soft tissue abnormality. Craniocervical junction within normal limits. Sinuses/Orbits: Prior bilateral ocular lens replacement. Mild chronic mucosal thickening noted throughout the paranasal sinuses. No air-fluid levels to suggest acute sinusitis. No mastoid effusion. Other: None. MRA HEAD FINDINGS ANTERIOR CIRCULATION: Visualized distal cervical segments of the internal carotid arteries are patent with antegrade flow. Petrous, cavernous, and supraclinoid segments patent without hemodynamically significant stenosis. 2 mm outpouching extending inferiorly from the supraclinoid left ICA favored to reflect a small vascular infundibulum related to a hypoplastic left posterior communicating artery. A1 segments patent bilaterally. Normal anterior communicating artery complex. Both anterior cerebral arteries patent without stenosis. Right ACA dominant. No M1 stenosis or occlusion. No proximal MCA branch occlusion. Distal MCA branches perfused and symmetric. POSTERIOR CIRCULATION: Visualized distal V4 segments widely patent. Basilar widely patent to its distal aspect without stenosis.  Superior cerebral arteries patent bilaterally. Both PCAs primarily supplied via the basilar. Left PCA widely patent to its distal aspect. Focal severe distal right P3 stenosis noted (series 1049, image 6). Right PCA otherwise widely patent as well. No intracranial aneurysm or other vascular malformation. MRA NECK FINDINGS AORTIC ARCH: Aortic arch and origin of the great vessels not included or evaluated on this exam. RIGHT CAROTID SYSTEM: Visualized portions of the right CCA are widely patent. No significant atheromatous irregularity or narrowing about the right carotid bulb for patient age. Visualized right ICA patent without stenosis or dissection. LEFT CAROTID SYSTEM: Visualized left CCA widely patent. No significant atheromatous narrowing or irregularity about the left carotid bulb for patient age. Visualized left ICA widely patent without stenosis or dissection. VERTEBRAL ARTERIES: Codominant. Neither vertebral artery origin visualized. Visualized portions patent without stenosis or dissection. IMPRESSION: MRI HEAD: 1. No acute intracranial abnormality. 2. Generalized age-related cerebral atrophy with mild chronic small vessel ischemic disease. MRA HEAD: 1. Negative intracranial MRA for large vessel occlusion or other emergent finding. 2. Focal severe distal right P3 stenosis. 3. Otherwise wide patency of the major intracranial arterial circulation. No other hemodynamically significant or correctable stenosis. MRA NECK: Normal MRA of the neck. Electronically Signed   By: Jeannine Boga M.D.   On: 12/31/2021 02:50   MR ANGIO NECK WO CONTRAST  Result Date: 12/31/2021 CLINICAL DATA:  Initial evaluation for neuro deficit, stroke suspected. EXAM: MRI HEAD WITHOUT CONTRAST MRA HEAD WITHOUT CONTRAST MRA NECK WITHOUT CONTRAST TECHNIQUE: Multiplanar, multiecho pulse sequences of the brain and surrounding structures were obtained without intravenous contrast. Angiographic images of the Circle of Willis were  obtained using MRA technique without intravenous contrast. Angiographic images of the neck were obtained using MRA technique without intravenous contrast. Carotid stenosis measurements (when applicable) are obtained utilizing NASCET criteria, using the distal internal carotid diameter as the denominator. COMPARISON:  CT from 12/30/2021. FINDINGS: MRI HEAD FINDINGS Brain: Diffuse prominence of the CSF containing spaces compatible generalized age-related cerebral atrophy. Patchy and confluent T2/FLAIR hyperintensity involving the periventricular and deep white matter both cerebral hemispheres, most consistent with chronic small vessel ischemic disease, mild for age. No evidence for acute or subacute infarct. Gray-white matter differentiation maintained. No areas of chronic cortical infarction. No acute intracranial hemorrhage. Single chronic microhemorrhage noted at the inferior left cerebellum, of doubtful significance in isolation. No mass lesion, midline shift or mass effect. Mild ventricular prominence related to global parenchymal volume loss without hydrocephalus. No extra-axial fluid collection. Pituitary  gland and suprasellar region within normal limits. Vascular: Major intracranial vascular flow voids are maintained. Skull and upper cervical spine: Bone marrow signal intensity within normal limits. No scalp soft tissue abnormality. Craniocervical junction within normal limits. Sinuses/Orbits: Prior bilateral ocular lens replacement. Mild chronic mucosal thickening noted throughout the paranasal sinuses. No air-fluid levels to suggest acute sinusitis. No mastoid effusion. Other: None. MRA HEAD FINDINGS ANTERIOR CIRCULATION: Visualized distal cervical segments of the internal carotid arteries are patent with antegrade flow. Petrous, cavernous, and supraclinoid segments patent without hemodynamically significant stenosis. 2 mm outpouching extending inferiorly from the supraclinoid left ICA favored to reflect a  small vascular infundibulum related to a hypoplastic left posterior communicating artery. A1 segments patent bilaterally. Normal anterior communicating artery complex. Both anterior cerebral arteries patent without stenosis. Right ACA dominant. No M1 stenosis or occlusion. No proximal MCA branch occlusion. Distal MCA branches perfused and symmetric. POSTERIOR CIRCULATION: Visualized distal V4 segments widely patent. Basilar widely patent to its distal aspect without stenosis. Superior cerebral arteries patent bilaterally. Both PCAs primarily supplied via the basilar. Left PCA widely patent to its distal aspect. Focal severe distal right P3 stenosis noted (series 1049, image 6). Right PCA otherwise widely patent as well. No intracranial aneurysm or other vascular malformation. MRA NECK FINDINGS AORTIC ARCH: Aortic arch and origin of the great vessels not included or evaluated on this exam. RIGHT CAROTID SYSTEM: Visualized portions of the right CCA are widely patent. No significant atheromatous irregularity or narrowing about the right carotid bulb for patient age. Visualized right ICA patent without stenosis or dissection. LEFT CAROTID SYSTEM: Visualized left CCA widely patent. No significant atheromatous narrowing or irregularity about the left carotid bulb for patient age. Visualized left ICA widely patent without stenosis or dissection. VERTEBRAL ARTERIES: Codominant. Neither vertebral artery origin visualized. Visualized portions patent without stenosis or dissection. IMPRESSION: MRI HEAD: 1. No acute intracranial abnormality. 2. Generalized age-related cerebral atrophy with mild chronic small vessel ischemic disease. MRA HEAD: 1. Negative intracranial MRA for large vessel occlusion or other emergent finding. 2. Focal severe distal right P3 stenosis. 3. Otherwise wide patency of the major intracranial arterial circulation. No other hemodynamically significant or correctable stenosis. MRA NECK: Normal MRA of the  neck. Electronically Signed   By: Jeannine Boga M.D.   On: 12/31/2021 02:50   DG Chest Port 1 View  Result Date: 12/30/2021 CLINICAL DATA:  Left-sided weakness EXAM: PORTABLE CHEST 1 VIEW COMPARISON:  03/30/2020 FINDINGS: Cardiac size is within normal limits. There are no signs of pulmonary edema or new focal infiltrates. Calcified granulomas are seen in left lower lung field. There is no pleural effusion or pneumothorax. Deformity in proximal left humerus may be residual from previous injury. Degenerative changes are noted in left shoulder. IMPRESSION: There are no signs of pulmonary edema or new focal infiltrates. Electronically Signed   By: Elmer Picker M.D.   On: 12/30/2021 17:20   CT HEAD CODE STROKE WO CONTRAST  Result Date: 12/30/2021 CLINICAL DATA:  Code stroke. Acute onset of left upper extremity weakness and blurred vision. Left-sided weakness and facial droop. Last known well at 9 p.m. last night. EXAM: CT HEAD WITHOUT CONTRAST TECHNIQUE: Contiguous axial images were obtained from the base of the skull through the vertex without intravenous contrast. RADIATION DOSE REDUCTION: This exam was performed according to the departmental dose-optimization program which includes automated exposure control, adjustment of the mA and/or kV according to patient size and/or use of iterative reconstruction technique. COMPARISON:  CT head without  contrast 03/30/2020 FINDINGS: Brain: No acute infarct, hemorrhage, or mass lesion is present. Basal ganglia are intact. Insular ribbon is normal. No acute or focal cortical abnormality is present. Moderate generalized atrophy and white matter disease is present. The ventricles are proportionate to the degree of atrophy. No significant extraaxial fluid collection is present. No significant interval change is present. Vascular: Atherosclerotic calcifications are present within the cavernous internal carotid arteries bilaterally. No hyperdense vessel is  present. Skull: Calvarium is intact. No focal lytic or blastic lesions are present. No significant extracranial soft tissue lesion is present. Sinuses/Orbits: The paranasal sinuses and mastoid air cells are clear. Bilateral lens replacements are noted. Globes and orbits are otherwise unremarkable. ASPECTS [facility] Stroke Program Early CT Score) - Ganglionic level infarction (caudate, lentiform nuclei, internal capsule, insula, M1-M3 cortex): 7/7 - Supraganglionic infarction (M4-M6 cortex): 3/3 Total score (0-10 with 10 being normal): 10/10 IMPRESSION: 1. No acute intracranial abnormality or significant interval change. 2. Stable moderate generalized atrophy and white matter disease. This likely reflects the sequela of chronic microvascular ischemia. The above was relayed via text pager to Dr. Leonel Ramsay on 12/30/2021 at 15:12 . Electronically Signed   By: San Morelle M.D.   On: 12/30/2021 15:12    Assessment/Plan 1. Overactive bladder Patient's symptoms are well-controlled on myrbetriq 50 mg daily. Will defer decreasing or discontinuing at this time.   2. Personal history of TIA (transient ischemic attack) Nurse asked for patient to be seen due to increased sleepiness, however, upon arousal patient able to straighten himself in his chair. Normal voice. Strength intact in all extremities. Discussed concerns with daughter today, and she would like to keep medications unchanged at this time.  - Will discuss DNH orders at follow up visit and continue goals of care conversations.  - Continue Plavix and ASA  3. Restless leg syndrome Patients symptoms are well-controlled on current regimen. Will continue current doses of ropinirole 0.5 mg nightly.   4. Stage 3b chronic kidney disease (CKD) ([employer]) Most recent eGFR 10/20/2021 was 31 with Cr of 1.92. Discussed with daughter concern for increased levels of medication in body due to kidney function. Due to concern for decreasing dose of medications  previously and patient's mental health decompensation, will defer at this time.   5. Depression with anxiety Patient has had a combination therapy of Wellbutrin 150 mg daily and Sertraline 150 mg daily. Discussed concern for max dose sertraline being 100 mg. Given patient's symptom control, plan to continue current regimen.   6. Essential hypertension Patient's blood pressure currently well-controlled on current medications.   7. Moderate vascular dementia with anxiety ([place]) Patient's behaviors well-managed on current regimen. Weights remain stable. Tolerating a full regular diet. Continue to monitor for decline.   8. Bipolar 1 disorder ([place]) Patient's symptoms well-controlled with abilify 5 mg BID, will continue    Family/ staff Communication: Discussed plan with Nurse Gaspar Bidding and Daughter, Evan Cole ordered:  none Tomasa Rand, MD, [facility] [phone]

## 2022-02-04 ENCOUNTER — Non-Acute Institutional Stay (SKILLED_NURSING_FACILITY): Payer: Medicare Other | Admitting: Student

## 2022-02-04 ENCOUNTER — Encounter: Payer: Self-pay | Admitting: Student

## 2022-02-04 DIAGNOSIS — N400 Enlarged prostate without lower urinary tract symptoms: Secondary | ICD-10-CM | POA: Insufficient documentation

## 2022-02-04 DIAGNOSIS — Z8546 Personal history of malignant neoplasm of prostate: Secondary | ICD-10-CM | POA: Insufficient documentation

## 2022-02-04 DIAGNOSIS — Z461 Encounter for fitting and adjustment of hearing aid: Secondary | ICD-10-CM | POA: Insufficient documentation

## 2022-02-04 DIAGNOSIS — H3530 Unspecified macular degeneration: Secondary | ICD-10-CM | POA: Insufficient documentation

## 2022-02-04 DIAGNOSIS — I10 Essential (primary) hypertension: Secondary | ICD-10-CM | POA: Diagnosis not present

## 2022-02-04 DIAGNOSIS — F3340 Major depressive disorder, recurrent, in remission, unspecified: Secondary | ICD-10-CM | POA: Insufficient documentation

## 2022-02-04 DIAGNOSIS — E7800 Pure hypercholesterolemia, unspecified: Secondary | ICD-10-CM | POA: Insufficient documentation

## 2022-02-04 DIAGNOSIS — F01B4 Vascular dementia, moderate, with anxiety: Secondary | ICD-10-CM

## 2022-02-04 DIAGNOSIS — B5801 Toxoplasma chorioretinitis: Secondary | ICD-10-CM | POA: Insufficient documentation

## 2022-02-04 DIAGNOSIS — H543 Unqualified visual loss, both eyes: Secondary | ICD-10-CM | POA: Insufficient documentation

## 2022-02-04 DIAGNOSIS — F17200 Nicotine dependence, unspecified, uncomplicated: Secondary | ICD-10-CM | POA: Insufficient documentation

## 2022-02-04 DIAGNOSIS — R159 Full incontinence of feces: Secondary | ICD-10-CM | POA: Insufficient documentation

## 2022-02-04 DIAGNOSIS — H9190 Unspecified hearing loss, unspecified ear: Secondary | ICD-10-CM | POA: Insufficient documentation

## 2022-02-04 DIAGNOSIS — H903 Sensorineural hearing loss, bilateral: Secondary | ICD-10-CM | POA: Insufficient documentation

## 2022-02-04 NOTE — Progress Notes (Signed)
Location:  Other Nursing Home Room Number: 725 Place of Service:  Nursing [phone]) Provider:  Dewayne Shorter, MD  Dewayne Shorter, MD  Patient Care Team: Dewayne Shorter, MD as PCP - General (Family Medicine)  Extended Emergency Contact Information Primary Emergency Contact: Lynn Mobile Phone: [phone] Relation: Daughter Secondary Emergency Contact: Balinda Quails Address: [address]          [place], [place] [address] [place] Phone: [phone] Relation: Spouse  Code Status:  DNAR Goals of care: Advanced Directive information    12/30/2021    6:01 PM  Advanced Directives  Does Patient Have a Medical Advance Directive? Yes  Type of Advance Directive [place]  Does patient want to make changes to medical advance directive? No - Patient declined  Copy of [place] in Chart? No - copy requested     Chief Complaint  Patient presents with   Weight Loss    HPI:  Pt is a 88 y.o. male seen today for an acute visit for concern for weight loss. Daughter, Judson Roch is at bedside. Primary goal is for patient to live to his 89th birthday for a party in 3 weeks. He has four children and they will all be here for his party. No desire to drink protein supplementation.   His wife and daughter are present for the encounter.    Past Medical History:  Diagnosis Date   Frequent falls    Hypertension    Shoulder dislocation    TIA (transient ischemic attack)    Past Surgical History:  Procedure Laterality Date   APPENDECTOMY     HERNIA REPAIR      Allergies  Allergen Reactions   Doxycycline Hives, Rash and Other (See Comments)   Benzonatate Other (See Comments)   Horse-Derived Products Hives   Other Hives    HORSE SERUM   Simvastatin Other (See Comments)   Tetanus Toxoid Other (See Comments)    Tetanus (horse-serum based tetanus)- unknown  Other reaction(s): Unknown Tetanus (horse-serum based  tetanus)- unknown  Other reaction(s): Unknown Tetanus (horse-serum based tetanus)- unknown Other reaction(s): Unknown Tetanus (horse-serum based tetanus)- unknown    Codeine Nausea Only, Other (See Comments) and Nausea And Vomiting    GI upset     Outpatient Encounter Medications as of 02/04/2022  Medication Sig   albuterol (VENTOLIN HFA) 108 (90 Base) MCG/ACT inhaler Inhale 2 puffs into the lungs every 6 (six) hours as needed.   budesonide (RHINOCORT AQUA) 32 MCG/ACT nasal spray USE 1 SPRAY IN NOSTRIL EVERY DAY   Calcium Carb-Cholecalciferol (CALCIUM/VITAMIN D PO) Take 1 tablet by mouth daily.   fluticasone furoate-vilanterol (BREO ELLIPTA) 100-25 MCG/ACT AEPB INHALE 1 PUFF BY ORAL INHALATION EVERY DAY   hydrochlorothiazide (HYDRODIURIL) 25 MG tablet Take 1 tablet by mouth daily.   losartan (COZAAR) 100 MG tablet Take 0.5 tablets by mouth daily.   [DISCONTINUED] atorvastatin (LIPITOR) 40 MG tablet Take 1 tablet by mouth at bedtime.   [DISCONTINUED] citalopram (CELEXA) 40 MG tablet Take 0.5 tablets by mouth daily.   [DISCONTINUED] dipyridamole-aspirin (AGGRENOX) 200-25 MG 12hr capsule TAKE 1 CAPSULE BY MOUTH TWICE A DAY BEFORE MEALS   [DISCONTINUED] fluvastatin (LESCOL) 20 MG capsule TAKE 2 CAPSULES BY MOUTH QHS (ATBEDTIME)   [DISCONTINUED] mirabegron ER (MYRBETRIQ) 25 MG TB24 tablet Take 1 tablet by mouth daily.   [DISCONTINUED] Multiple Vitamins-Minerals (PRESERVISION AREDS PO) Take 1 tablet by mouth daily.   [DISCONTINUED] rOPINIRole (REQUIP) 0.25 MG tablet Take 1 tablet  by mouth at bedtime.   acetaminophen (TYLENOL) 650 MG CR tablet Take 650 mg by mouth at bedtime.   ARIPiprazole (ABILIFY) 5 MG tablet Take 5 mg by mouth daily.   aspirin EC 81 MG tablet Take 81 mg by mouth daily. Swallow whole.   atorvastatin (LIPITOR) 40 MG tablet TAKE 1 TABLET BY MOUTH EVERY DAY   buPROPion (WELLBUTRIN XL) 150 MG 24 hr tablet Take 150 mg by mouth every morning.   docusate sodium (COLACE) 100 MG  capsule Take 100 mg by mouth 2 (two) times daily.   fexofenadine (ALLEGRA) 180 MG tablet fexofenadine 180 mg tablet  TAKE 1 TABLET (180 MG TOTAL) BY MOUTH ONCE DAILY. *OTC - NOT COVERED*   Melatonin 3 MG TABS Take 5 mg by mouth at bedtime.   Multiple Vitamins-Minerals (PRESERVISION AREDS PO) Take 2 capsules by mouth in the morning and at bedtime.   MYRBETRIQ 50 MG TB24 tablet Take 50 mg by mouth daily.   ondansetron (ZOFRAN-ODT) 4 MG disintegrating tablet Take 4 mg by mouth every 8 (eight) hours as needed for nausea or vomiting.   Polyethyl Glycol-Propyl Glycol (SYSTANE) 0.4-0.3 % SOLN Place 1 drop into both eyes every 4 (four) hours as needed.   polyethylene glycol (MIRALAX / GLYCOLAX) 17 g packet Take 17 g by mouth daily as needed for moderate constipation.   rOPINIRole (REQUIP) 0.5 MG tablet Take 0.5 mg by mouth at bedtime.   sertraline (ZOLOFT) 100 MG tablet Take 150 mg by mouth daily.   [DISCONTINUED] calcium-vitamin D (OSCAL WITH D) 500-5 MG-MCG tablet Take 1 tablet by mouth at bedtime.   [DISCONTINUED] clopidogrel (PLAVIX) 75 MG tablet Take 1 tablet (75 mg total) by mouth daily.   [DISCONTINUED] hydrochlorothiazide (HYDRODIURIL) 25 MG tablet hydrochlorothiazide 25 mg tablet   [DISCONTINUED] losartan (COZAAR) 50 MG tablet TAKE 1 TABLET BY MOUTH EVERYDAY AT BEDTIME   No facility-administered encounter medications on file as of 02/04/2022.    Review of Systems  Constitutional:  Positive for unexpected weight change.  Respiratory:  Positive for choking.    Immunization History  Administered Date(s) Administered   [employer] Vaccination 06/10/2019   Pertinent  Health Maintenance Due  Topic Date Due   INFLUENZA VACCINE  12/21/2021      12/30/2021    3:21 PM 12/30/2021    6:11 PM 12/31/2021   12:00 AM 12/31/2021   11:00 AM 12/31/2021    8:03 PM  Fall Risk  Patient Fall Risk Level Low fall risk High fall risk High fall risk High fall risk High fall risk   Functional Status  Survey:    Vitals:   02/04/22 2148  BP: (!) 99/50  Pulse: 70  Resp: 16  Temp: 97.6 F (36.4 C)  SpO2: 97%  Weight: 151 lb (68.5 kg)   Body mass index is 21.67 kg/m. Physical Exam Cardiovascular:     Rate and Rhythm: Normal rate.     Pulses: Normal pulses.  Pulmonary:     Effort: Pulmonary effort is normal.     Breath sounds: Normal breath sounds.  Neurological:     Mental Status: He is alert and oriented to person, place, and time.  Psychiatric:        Mood and Affect: Mood normal.   Labs reviewed: Recent Labs    12/30/21 1509 12/30/21 1515 12/31/21 0735  NA  --  135 137  K  --  4.1 3.9  CL  --  103 108  CO2  --  23  21*  GLUCOSE  --  103* 102*  BUN  --  36* 35*  CREATININE 2.10* 1.80* 1.62*  CALCIUM  --  8.7* 8.5*   Recent Labs    12/30/21 1515  AST 28  ALT 24  ALKPHOS 60  BILITOT 0.6  PROT 6.6  ALBUMIN 3.0*   Recent Labs    12/30/21 1515  WBC 15.4*  NEUTROABS 11.2*  HGB 10.4*  HCT 33.0*  MCV 90.9  PLT 521*   No results found for: "TSH" Lab Results  Component Value Date   HGBA1C 5.3 12/30/2021   Lab Results  Component Value Date   CHOL 107 12/31/2021   HDL 26 (L) 12/31/2021   LDLCALC 57 12/31/2021   TRIG 122 12/31/2021   CHOLHDL 4.1 12/31/2021    Significant Diagnostic Results in last 30 days:  No results found.  Assessment/Plan 1. Moderate vascular dementia with anxiety [facility]) Visit today to discuss and evaluate concern for weight loss. Patient has been noted to have coughing after meals. Unclear if this is due to aspiration or underlying COPD. Discussed with daughter concern for aspiration. Also discussed concern for weight loss. During discussion with daughter she agreed to an SLP consultation to determine need for changes in food consistency. Discussed with him concern for his weight loss and would like to defer supplementation at this time in hopes that patient will have sufficient caloric intake based on his   2.  Hypertension Currently taking losartan 100 mg daily for blood pressure which appears low today. Plan for hold parameters. Encourage hydration. Discuss continued goal directed dose reduction of medications.   Family/ staff Communication: Daughter Journalist, newspaper and nursing staff   Labs/tests ordered:  none  Tomasa Rand, MD, [place] [phone]

## 2022-02-08 ENCOUNTER — Encounter: Payer: Self-pay | Admitting: Nurse Practitioner

## 2022-02-08 NOTE — Progress Notes (Unsigned)
Location:   [facility] Room Number: 101 Place of Service:  SNF [phone]) Provider:  Sherrie Mustache, NP  Dewayne Shorter, MD  Patient Care Team: Dewayne Shorter, MD as PCP - General (Family Medicine)  Extended Emergency Contact Information Primary Emergency Contact: [place] Mobile Phone: [phone] Relation: Daughter Secondary Emergency Contact: Balinda Quails Address: [address]          [place], [place] [address] [place] Phone: [phone] Relation: Spouse  Code Status:  DNR Goals of care: Advanced Directive information    02/08/2022    4:34 PM  Advanced Directives  Does Patient Have a Medical Advance Directive? Yes  Type of Advance Directive Out of facility DNR (pink MOST or yellow form)  Does patient want to make changes to medical advance directive? No - Patient declined     Chief Complaint  Patient presents with   Acute Visit    Weight loss    HPI:  Pt is a 88 y.o. male seen today for an acute visit for    Past Medical History:  Diagnosis Date   Frequent falls    Hypertension    Shoulder dislocation    TIA (transient ischemic attack)    Past Surgical History:  Procedure Laterality Date   APPENDECTOMY     HERNIA REPAIR      Allergies  Allergen Reactions   Doxycycline Hives, Rash and Other (See Comments)   Benzonatate Other (See Comments)   Horse-Derived Products Hives   Other Hives    HORSE SERUM   Simvastatin Other (See Comments)   Tetanus Toxoid Other (See Comments)    Tetanus (horse-serum based tetanus)- unknown  Other reaction(s): Unknown Tetanus (horse-serum based tetanus)- unknown  Other reaction(s): Unknown Tetanus (horse-serum based tetanus)- unknown Other reaction(s): Unknown Tetanus (horse-serum based tetanus)- unknown    Codeine Nausea Only, Other (See Comments) and Nausea And Vomiting    GI upset     Allergies as of 02/08/2022       Reactions   Doxycycline  Hives, Rash, Other (See Comments)   Benzonatate Other (See Comments)   Horse-derived Products Hives   Other Hives   HORSE SERUM   Simvastatin Other (See Comments)   Tetanus Toxoid Other (See Comments)   Tetanus (horse-serum based tetanus)- unknown Other reaction(s): Unknown Tetanus (horse-serum based tetanus)- unknown Other reaction(s): Unknown Tetanus (horse-serum based tetanus)- unknown Other reaction(s): Unknown Tetanus (horse-serum based tetanus)- unknown   Codeine Nausea Only, Other (See Comments), Nausea And Vomiting   GI upset        Medication List        Accurate as of February 08, 2022  4:36 PM. If you have any questions, ask your nurse or doctor.          STOP taking these medications    budesonide 32 MCG/ACT nasal spray Commonly known as: RHINOCORT AQUA Stopped by: Lauree Chandler, NP   fluticasone furoate-vilanterol 100-25 MCG/ACT Aepb Commonly known as: BREO ELLIPTA Stopped by: Lauree Chandler, NP   hydrochlorothiazide 25 MG tablet Commonly known as: HYDRODIURIL Stopped by: Lauree Chandler, NP       TAKE these medications    acetaminophen 650 MG CR tablet Commonly known as: TYLENOL Take 650 mg by mouth every 4 (four) hours as needed.   albuterol 108 (90 Base) MCG/ACT inhaler Commonly known as: VENTOLIN HFA Inhale 2 puffs into the lungs every 4 (four) hours as needed.   ARIPiprazole 5 MG tablet Commonly  known as: ABILIFY Take 5 mg by mouth daily.   aspirin EC 81 MG tablet Take 81 mg by mouth daily. Swallow whole.   atorvastatin 40 MG tablet Commonly known as: LIPITOR TAKE 1 TABLET BY MOUTH EVERY DAY   buPROPion 150 MG 24 hr tablet Commonly known as: WELLBUTRIN XL Take 150 mg by mouth every morning.   CALCIUM/VITAMIN D PO Take 1 tablet by mouth daily.   carbamide peroxide 6.5 % OTIC solution Commonly known as: DEBROX Place 5 drops into both ears as needed.   clobetasol cream 0.05 % Commonly known as: TEMOVATE Apply 1  Application topically as needed. Apply to left ear   docusate sodium 100 MG capsule Commonly known as: COLACE Take 100 mg by mouth 2 (two) times daily.   fexofenadine 180 MG tablet Commonly known as: ALLEGRA fexofenadine 180 mg tablet  TAKE 1 TABLET (180 MG TOTAL) BY MOUTH ONCE DAILY. *OTC - NOT COVERED*   hydrocortisone cream 1 % Apply 1 Application topically 2 (two) times daily.   losartan 50 MG tablet Commonly known as: COZAAR Take 1 tablet by mouth daily.   melatonin 5 MG Tabs Take 5 mg by mouth at bedtime.   mirtazapine 15 MG tablet Commonly known as: REMERON Take 15 mg by mouth at bedtime.   Myrbetriq 50 MG Tb24 tablet Generic drug: mirabegron ER Take 50 mg by mouth daily.   ondansetron 4 MG disintegrating tablet Commonly known as: ZOFRAN-ODT Take 4 mg by mouth every 8 (eight) hours as needed for nausea or vomiting.   polyethylene glycol 17 g packet Commonly known as: MIRALAX / GLYCOLAX Take 17 g by mouth daily as needed for moderate constipation.   PRESERVISION AREDS PO Take 2 capsules by mouth in the morning and at bedtime.   rOPINIRole 0.5 MG tablet Commonly known as: REQUIP Take 0.5 mg by mouth at bedtime.   sertraline 100 MG tablet Commonly known as: ZOLOFT Take 100 mg by mouth daily.   Systane 0.4-0.3 % Soln Generic drug: Polyethyl Glycol-Propyl Glycol Place 1 drop into both eyes every 4 (four) hours as needed.   tamsulosin 0.4 MG Caps capsule Commonly known as: FLOMAX Take 0.4 mg by mouth.        Review of Systems  Immunization History  Administered Date(s) Administered   Influenza, High Dose Seasonal PF 04/12/2016, 02/22/2021   Influenza-Unspecified 02/21/2007, 02/25/2008, 02/20/2009, 03/24/2011, 02/21/2012, 01/21/2014, 04/04/2015, 04/12/2016, 03/22/2017, 02/28/2019   Moderna Sars-Covid-2 Vaccination 06/10/2019, 07/08/2019   Pneumococcal Conjugate-13 02/11/2016, 06/10/2016   Pneumococcal Polysaccharide-23 07/12/1999, 01/22/2007,  02/11/2016   Tdap 10/19/2010, 04/23/2011, 02/11/2016   Zoster Recombinat (Shingrix) 12/08/2017   Zoster, Live 11/07/2014   Pertinent  Health Maintenance Due  Topic Date Due   INFLUENZA VACCINE  12/21/2021      12/30/2021    3:21 PM 12/30/2021    6:11 PM 12/31/2021   12:00 AM 12/31/2021   11:00 AM 12/31/2021    8:03 PM  Fall Risk  Patient Fall Risk Level Low fall risk High fall risk High fall risk High fall risk High fall risk   Functional Status Survey:    Vitals:   02/08/22 1608  BP: (!) 99/50  Pulse: 70  Resp: 16  Temp: 97.6 F (36.4 C)  SpO2: 97%  Weight: 151 lb (68.5 kg)  Height: '5\' 10"'$  (1.778 m)   Body mass index is 21.67 kg/m. Physical Exam  Labs reviewed: Recent Labs    12/30/21 1509 12/30/21 1515 12/31/21 0735  NA  --  135 137  K  --  4.1 3.9  CL  --  103 108  CO2  --  23 21*  GLUCOSE  --  103* 102*  BUN  --  36* 35*  CREATININE 2.10* 1.80* 1.62*  CALCIUM  --  8.7* 8.5*   Recent Labs    12/30/21 1515  AST 28  ALT 24  ALKPHOS 60  BILITOT 0.6  PROT 6.6  ALBUMIN 3.0*   Recent Labs    12/30/21 1515  WBC 15.4*  NEUTROABS 11.2*  HGB 10.4*  HCT 33.0*  MCV 90.9  PLT 521*   No results found for: "TSH" Lab Results  Component Value Date   HGBA1C 5.3 12/30/2021   Lab Results  Component Value Date   CHOL 107 12/31/2021   HDL 26 (L) 12/31/2021   LDLCALC 57 12/31/2021   TRIG 122 12/31/2021   CHOLHDL 4.1 12/31/2021    Significant Diagnostic Results in last 30 days:  No results found.  Assessment/Plan There are no diagnoses linked to this encounter.   Family/ staff Communication:   Labs/tests ordered:

## 2022-02-10 NOTE — Progress Notes (Signed)
err

## 2022-02-16 ENCOUNTER — Encounter: Payer: Self-pay | Admitting: Student

## 2022-02-16 ENCOUNTER — Non-Acute Institutional Stay (SKILLED_NURSING_FACILITY): Payer: Medicare Other | Admitting: Student

## 2022-02-16 DIAGNOSIS — R112 Nausea with vomiting, unspecified: Secondary | ICD-10-CM | POA: Diagnosis not present

## 2022-02-16 NOTE — Progress Notes (Signed)
Location:  [facility] Room Number: 235-T Place of Service:  SNF (31) Provider:  Dewayne Shorter, MD  Patient Care Team: Dewayne Shorter, MD as PCP - General (Family Medicine)  Extended Emergency Contact Information Primary Emergency Contact: [place] Mobile Phone: [phone] Relation: Daughter Secondary Emergency Contact: Balinda Quails Address: [address]          [place], [place] [address] [place] Phone: [phone] Relation: Spouse  Code Status:  DNR Goals of care: Advanced Directive information    02/16/2022   11:20 AM  Advanced Directives  Does Patient Have a Medical Advance Directive? Yes  Type of Advance Directive Out of facility DNR (pink MOST or yellow form)  Does patient want to make changes to medical advance directive? No - Patient declined     Chief Complaint  Patient presents with   Emesis    Patient is having some nausea and vomiting. Discussed need for shingles, covid and flu vaccine     HPI:  Pt is a 88 y.o. male seen today for an acute visit for nausea and vomiting. Patient as weekly emesis. Today's episode was dark brown-black in color. Nursing states it was not coffee ground emesis. Patient was able to tolerate his breakfast, fluids, and medications. Zofran today and had improvement of symptoms.    Past Medical History:  Diagnosis Date   Frequent falls    Hypertension    Shoulder dislocation    TIA (transient ischemic attack)    Past Surgical History:  Procedure Laterality Date   APPENDECTOMY     HERNIA REPAIR      Allergies  Allergen Reactions   Doxycycline Hives, Rash and Other (See Comments)   Benzonatate Other (See Comments)   Horse-Derived Products Hives   Other Hives    HORSE SERUM   Simvastatin Other (See Comments)   Tetanus Toxoid Other (See Comments)    Tetanus (horse-serum based tetanus)- unknown  Other reaction(s): Unknown Tetanus (horse-serum based tetanus)-  unknown  Other reaction(s): Unknown Tetanus (horse-serum based tetanus)- unknown Other reaction(s): Unknown Tetanus (horse-serum based tetanus)- unknown    Codeine Nausea Only, Other (See Comments) and Nausea And Vomiting    GI upset     Outpatient Encounter Medications as of 02/16/2022  Medication Sig   acetaminophen (TYLENOL) 650 MG CR tablet Take 650 mg by mouth every 4 (four) hours as needed.   albuterol (VENTOLIN HFA) 108 (90 Base) MCG/ACT inhaler Inhale 2 puffs into the lungs every 4 (four) hours as needed.   ARIPiprazole (ABILIFY) 5 MG tablet Take 5 mg by mouth daily.   aspirin EC 81 MG tablet Take 81 mg by mouth daily. Swallow whole.   atorvastatin (LIPITOR) 40 MG tablet Take 40 mg by mouth at bedtime.   buPROPion (WELLBUTRIN XL) 150 MG 24 hr tablet Take 150 mg by mouth every morning.   Calcium Carb-Cholecalciferol (CALCIUM/VITAMIN D PO) Take 1 tablet by mouth daily.   carbamide peroxide (DEBROX) 6.5 % OTIC solution Place 5 drops into both ears as needed.   clobetasol cream (TEMOVATE) 9.98 % Apply 1 Application topically as needed. Apply to left ear   docusate sodium (COLACE) 100 MG capsule Take 100 mg by mouth 2 (two) times daily.   fexofenadine (ALLEGRA) 180 MG tablet fexofenadine 180 mg tablet  TAKE 1 TABLET (180 MG TOTAL) BY MOUTH ONCE DAILY. *OTC - NOT COVERED*   hydrocortisone cream 1 % Apply 1 Application topically every 12 (twelve) hours as needed for itching.  losartan (COZAAR) 50 MG tablet Take 1 tablet by mouth at bedtime.   magnesium hydroxide (MILK OF MAGNESIA) 400 MG/5ML suspension Take 30 mLs by mouth every 12 (twelve) hours as needed for mild constipation.   melatonin 5 MG TABS Take 5 mg by mouth daily. At bedtime   Multiple Vitamins-Minerals (PRESERVISION AREDS PO) Take 2 capsules by mouth in the morning and at bedtime.   MYRBETRIQ 50 MG TB24 tablet Take 50 mg by mouth daily.   ondansetron (ZOFRAN-ODT) 4 MG disintegrating tablet Take 4 mg by mouth every 8  (eight) hours as needed for nausea or vomiting.   Polyethyl Glycol-Propyl Glycol (SYSTANE) 0.4-0.3 % SOLN Place 1 drop into both eyes every 4 (four) hours as needed.   polyethylene glycol (MIRALAX / GLYCOLAX) 17 g packet Take 17 g by mouth daily as needed for moderate constipation.   rOPINIRole (REQUIP) 0.5 MG tablet Take 0.5 mg by mouth at bedtime.   sertraline (ZOLOFT) 100 MG tablet Take 100 mg by mouth daily.   tamsulosin (FLOMAX) 0.4 MG CAPS capsule Take 0.4 mg by mouth.   [DISCONTINUED] mirtazapine (REMERON) 15 MG tablet Take 15 mg by mouth at bedtime. (Patient not taking: Reported on 02/16/2022)   No facility-administered encounter medications on file as of 02/16/2022.    Review of Systems  Constitutional:  Negative for chills, diaphoresis and fever.  Respiratory:  Negative for shortness of breath.   Cardiovascular:  Negative for chest pain.    Immunization History  Administered Date(s) Administered   Influenza, High Dose Seasonal PF 04/12/2016, 02/22/2021   Influenza-Unspecified 02/21/2007, 02/25/2008, 02/20/2009, 03/24/2011, 02/21/2012, 01/21/2014, 04/04/2015, 04/12/2016, 03/22/2017, 02/28/2019   Moderna Sars-Covid-2 Vaccination 06/10/2019, 07/08/2019   Pneumococcal Conjugate-13 02/11/2016, 06/10/2016   Pneumococcal Polysaccharide-23 07/12/1999, 01/22/2007, 02/11/2016   Tdap 10/19/2010, 04/23/2011, 02/11/2016   Zoster Recombinat (Shingrix) 12/08/2017   Zoster, Live 11/07/2014   Pertinent  Health Maintenance Due  Topic Date Due   INFLUENZA VACCINE  12/21/2021      12/30/2021    3:21 PM 12/30/2021    6:11 PM 12/31/2021   12:00 AM 12/31/2021   11:00 AM 12/31/2021    8:03 PM  Fall Risk  Patient Fall Risk Level Low fall risk High fall risk High fall risk High fall risk High fall risk   Functional Status Survey:    Vitals:   02/16/22 1051  BP: (!) 99/50  Pulse: 70  Resp: 16  Temp: 97.6 F (36.4 C)  SpO2: 97%  Weight: 151 lb (68.5 kg)   Body mass index is 21.67  kg/m. Physical Exam Cardiovascular:     Rate and Rhythm: Normal rate.     Pulses: Normal pulses.  Pulmonary:     Effort: Pulmonary effort is normal.     Breath sounds: Normal breath sounds.  Abdominal:     General: Abdomen is flat. Bowel sounds are normal.     Palpations: Abdomen is soft.  Skin:    General: Skin is warm and dry.  Neurological:     Mental Status: He is alert and oriented to person, place, and time.     Labs reviewed: Recent Labs    10/15/21 0000 12/30/21 1509 12/30/21 1515 12/31/21 0735  NA 137  --  135 137  K 4.4  4.4  --  4.1 3.9  CL 106  106  --  103 108  CO2 26*  26*  --  23 21*  GLUCOSE  --   --  103* 102*  BUN 45*  --  36* 35*  CREATININE 1.9* 2.10* 1.80* 1.62*  CALCIUM 8.9  --  8.7* 8.5*   Recent Labs    10/15/21 0000 12/30/21 1515  AST 19 28  ALT 15 24  ALKPHOS 44 60  BILITOT  --  0.6  PROT  --  6.6  ALBUMIN 3.4* 3.0*   Recent Labs    10/15/21 0000 12/30/21 1515  WBC 6.1 15.4*  NEUTROABS 3,605.00 11.2*  HGB 11.1* 10.4*  HCT 34* 33.0*  MCV  --  90.9  PLT 252 521*   No results found for: "TSH" Lab Results  Component Value Date   HGBA1C 5.3 12/30/2021   Lab Results  Component Value Date   CHOL 107 12/31/2021   HDL 26 (L) 12/31/2021   LDLCALC 57 12/31/2021   TRIG 122 12/31/2021   CHOLHDL 4.1 12/31/2021    Significant Diagnostic Results in last 30 days:  No results found.  Assessment/Plan 1. Nausea and vomiting, unspecified vomiting type Patient had an episode of vomiting today. Unclear of contents of vomit. Patient is able to tolerate a regular diet.Vital signs stable. Discussed patient can continue to have zofran PRN.    Family/ staff Communication: nursing staff updated  Labs/tests ordered:  deferred at this time.   Tomasa Rand, MD, [facility] [phone]

## 2022-02-21 IMAGING — CT CT HEAD W/O CM
3 series · 14 of 47 positions shown, 16 images · non-contrast
Comparison: CT August 15, 2017.

CLINICAL DATA: Trauma.  Fall.

EXAM:
CT HEAD WITHOUT CONTRAST
CT CERVICAL SPINE WITHOUT CONTRAST
TECHNIQUE: Multidetector CT imaging of the head and cervical spine was
performed following the standard protocol without intravenous
contrast. Multiplanar CT image reconstructions of the cervical spine
were also generated.

[Series 2: head wo · axial · 0.45mm/px · z∈[+990,+1130]mm · 8 of 34 slices shown, 10 images]
[im 3/34  brain]
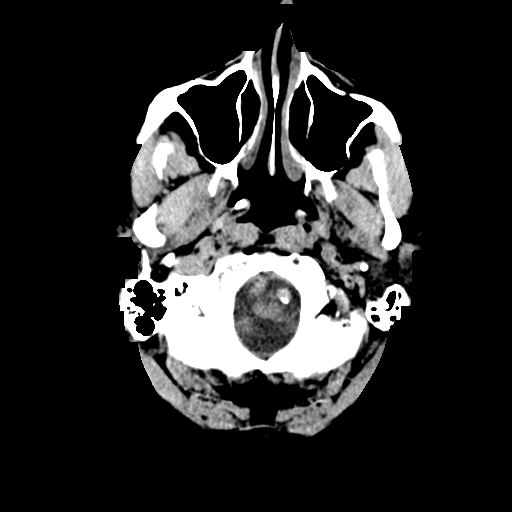
[im 3/34  bone]
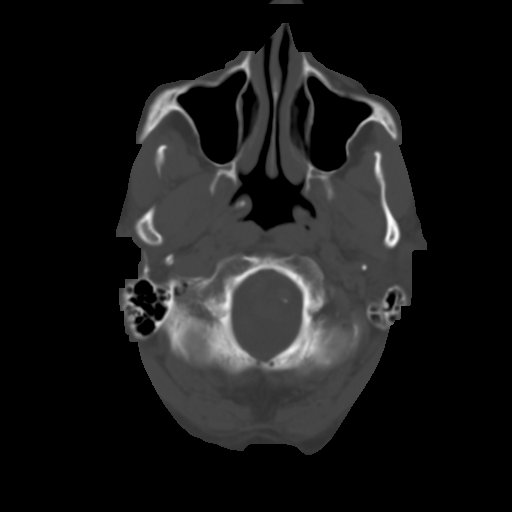
[im 7/34  brain]
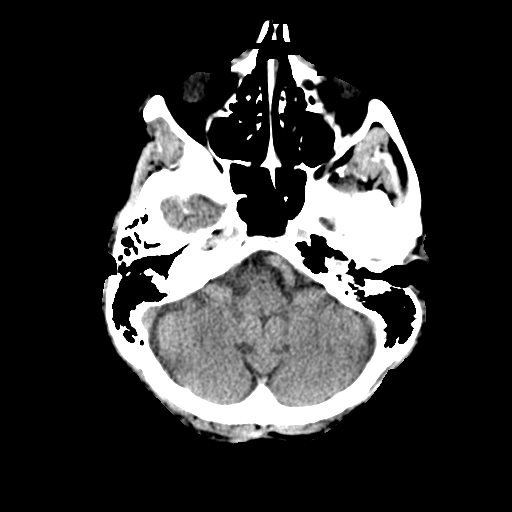
[im 11/34  brain]
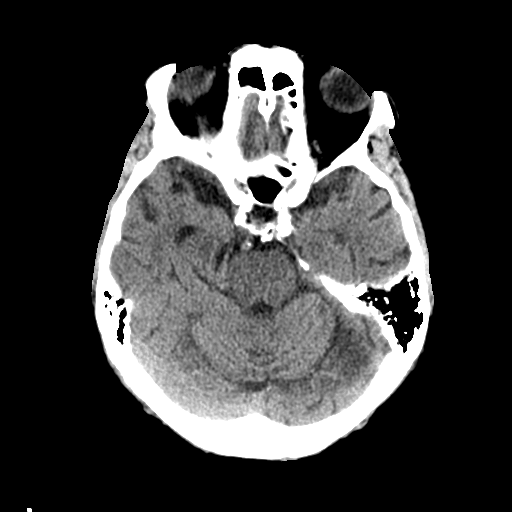
[im 15/34  brain]
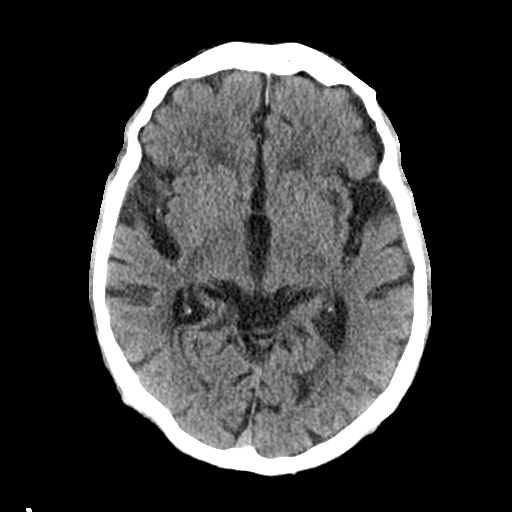
[im 19/34  brain]
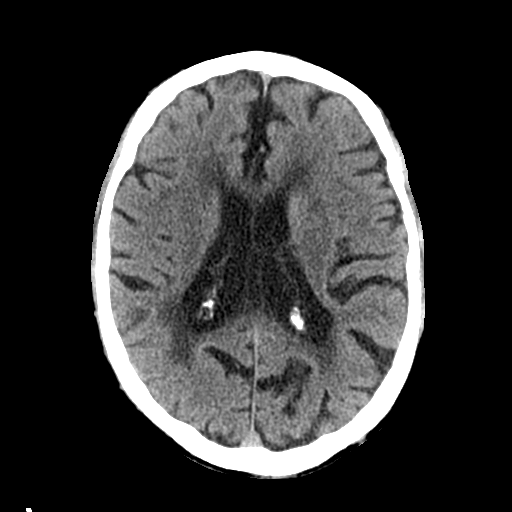
[im 19/34  bone]
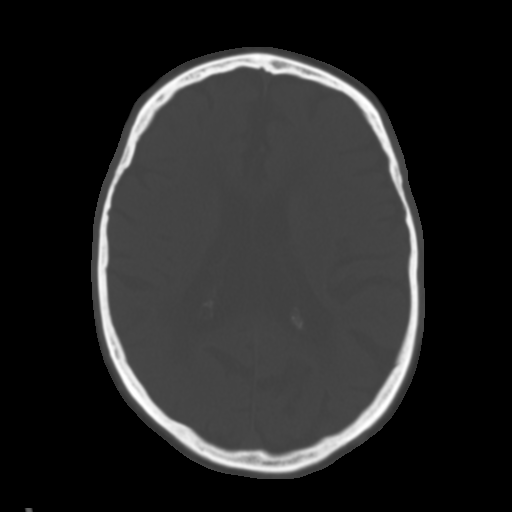
[im 23/34  brain]
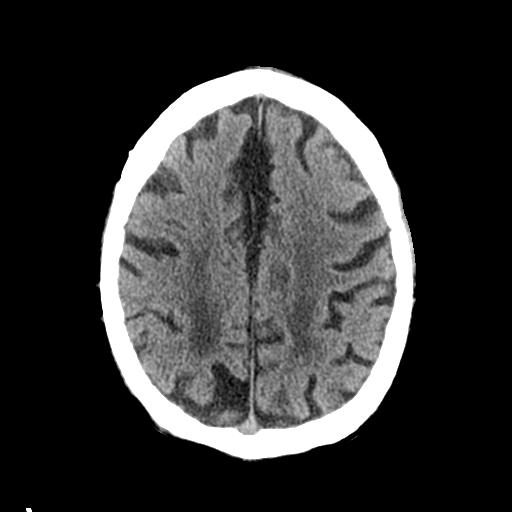
[im 27/34  brain]
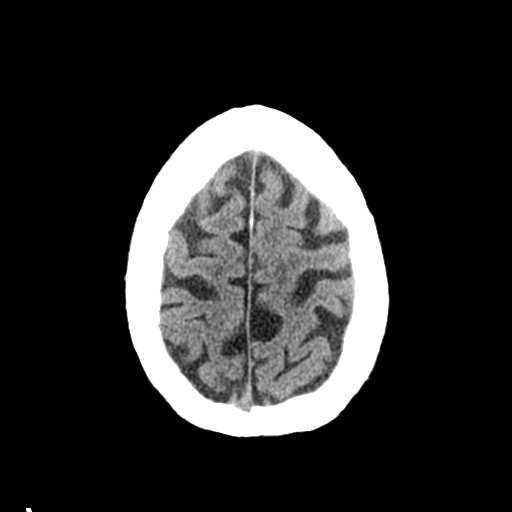
[im 31/34  brain]
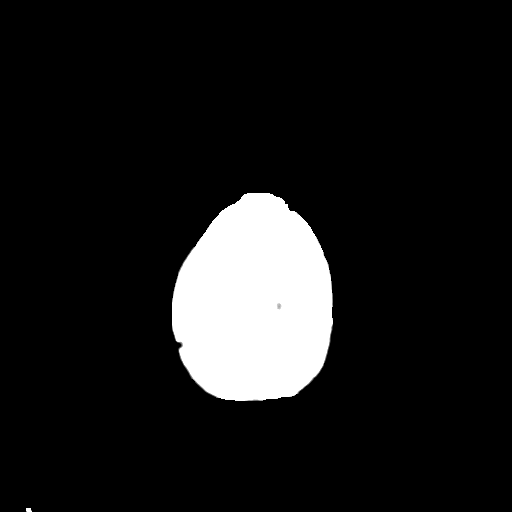

[Series 4: coronal soft tissue · coronal · 0.34mm/px · 3 of 65 slices shown]
[im 22/65  brain]
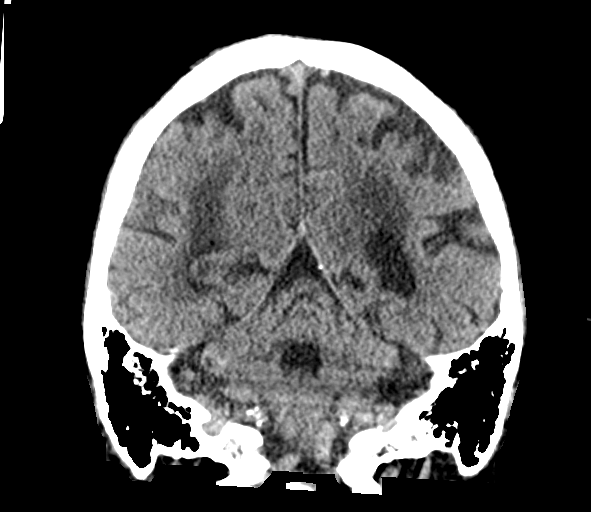
[im 29/65  brain]
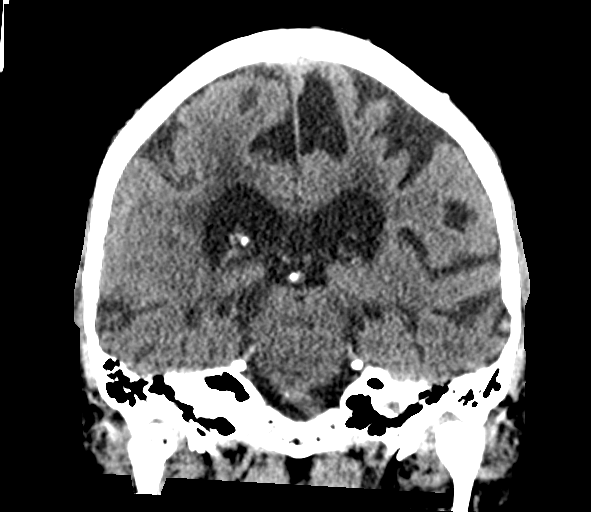
[im 36/65  brain]
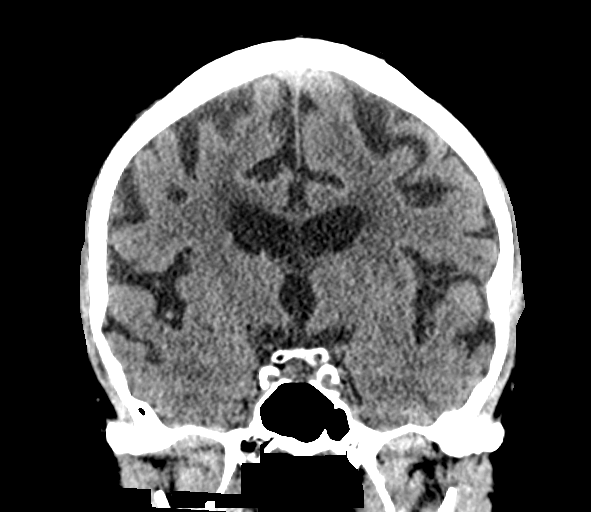

[Series 5: sagittal soft tissue · sagittal · 0.32mm/px · 3 of 55 slices shown]
[im 19/55  brain]
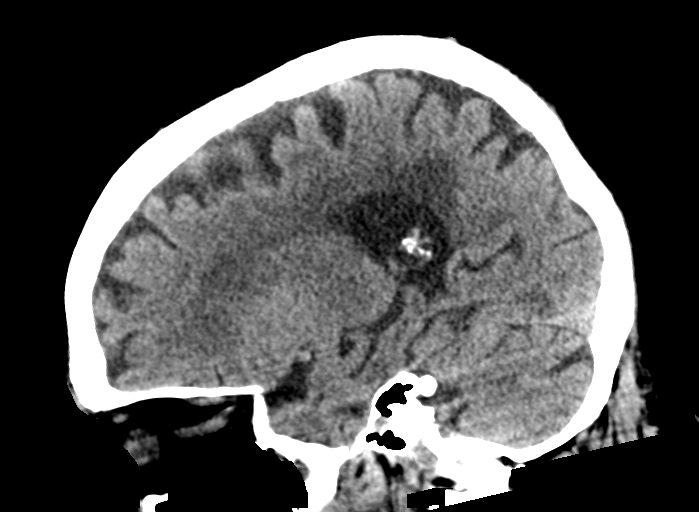
[im 28/55  brain]
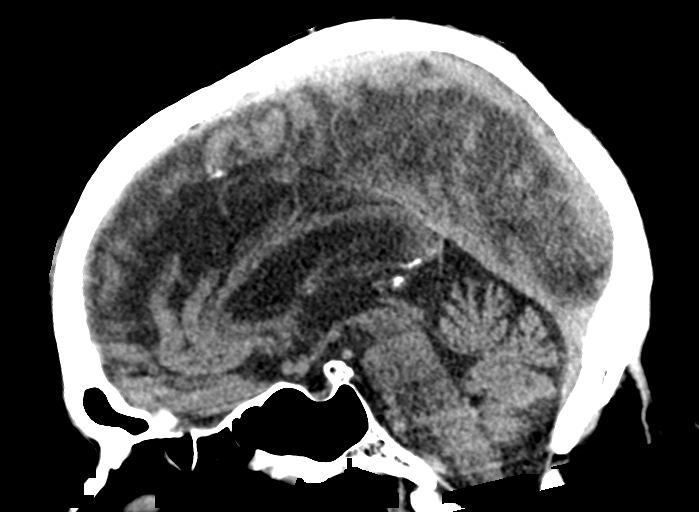
[im 37/55  brain]
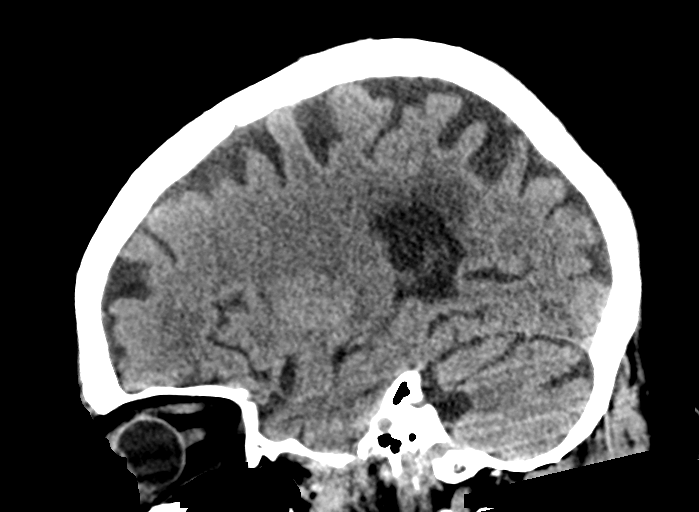

[14 of 47 positions shown; findings below may reference images not displayed]

FINDINGS: CT HEAD FINDINGS

Brain: No evidence of acute infarction, hemorrhage, hydrocephalus,
extra-axial collection or mass lesion/mass effect. Similar patchy
white matter hypoattenuation, most consistent with chronic
microvascular ischemic change. Similar generalized atrophy with ex
vacuo ventricular dilation.

Vascular: Calcific atherosclerosis.

Skull: No acute fracture.  Frontal scalp contusion

Sinuses/Orbits: Mild scattered paranasal sinus mucosal thickening
without air-fluid levels. No evidence of acute orbital abnormality.

Other: No mastoid effusions.

CT CERVICAL SPINE FINDINGS

Alignment: Normal.

Skull base and vertebrae: No acute fracture. No new vertebral body
height loss. No primary bone lesion or focal pathologic process.

Soft tissues and spinal canal: No prevertebral fluid or swelling. No
visible canal hematoma.

Disc levels: Multilevel degenerative change, including facet
uncovertebral hypertrophy with possibly severe right foraminal
stenosis at C4-C5.

Upper chest: Negative.

Other: Atherosclerotic vascular calcifications.
IMPRESSION: CT head:

1. No evidence of acute intracranial abnormality.
2. Frontal scalp contusion without acute fracture.
3. Chronic microvascular ischemic change and generalized atrophy.

CT cervical spine:

1. No evidence of acute fracture or traumatic malalignment.
2. Multilevel degenerative change, including possibly severe right
foraminal stenosis C4-C5. MRI of the cervical spine could further
characterize if clinically indicated.

## 2022-02-25 ENCOUNTER — Non-Acute Institutional Stay (SKILLED_NURSING_FACILITY): Payer: Medicare Other | Admitting: Student

## 2022-02-25 ENCOUNTER — Encounter: Payer: Self-pay | Admitting: Student

## 2022-02-25 DIAGNOSIS — N183 Chronic kidney disease, stage 3 unspecified: Secondary | ICD-10-CM

## 2022-02-25 DIAGNOSIS — G2581 Restless legs syndrome: Secondary | ICD-10-CM

## 2022-02-25 DIAGNOSIS — H903 Sensorineural hearing loss, bilateral: Secondary | ICD-10-CM

## 2022-02-25 DIAGNOSIS — F01B4 Vascular dementia, moderate, with anxiety: Secondary | ICD-10-CM | POA: Diagnosis not present

## 2022-02-25 DIAGNOSIS — J449 Chronic obstructive pulmonary disease, unspecified: Secondary | ICD-10-CM

## 2022-02-25 DIAGNOSIS — I10 Essential (primary) hypertension: Secondary | ICD-10-CM

## 2022-02-25 DIAGNOSIS — Z66 Do not resuscitate: Secondary | ICD-10-CM

## 2022-02-25 DIAGNOSIS — N3281 Overactive bladder: Secondary | ICD-10-CM

## 2022-02-25 DIAGNOSIS — F319 Bipolar disorder, unspecified: Secondary | ICD-10-CM

## 2022-02-25 DIAGNOSIS — F332 Major depressive disorder, recurrent severe without psychotic features: Secondary | ICD-10-CM

## 2022-02-25 NOTE — Progress Notes (Signed)
Location:  Other [facility]) Nursing Home Room Number: 222-L Place of Service:  SNF [id number]) Provider:  Dewayne Shorter, MD  Patient Care Team: Dewayne Shorter, MD as PCP - General (Family Medicine)  Extended Emergency Contact Information Primary Emergency Contact: [place] Mobile Phone: [phone] Relation: Daughter Secondary Emergency Contact: Balinda Quails Address: [address]          [place], [place] [address] [place] Phone: [phone] Relation: Spouse  Code Status:  DNR Goals of care: Advanced Directive information    02/25/2022   11:15 AM  Advanced Directives  Does Patient Have a Medical Advance Directive? Yes  Type of Advance Directive Out of facility DNR (pink MOST or yellow form)  Does patient want to make changes to medical advance directive? No - Patient declined     Chief Complaint  Patient presents with   Medical Management of Chronic Issues    Routine visit. Discuss need for shingrix, covid boosters and flu vaccine or post pone if patient refuses.     HPI:  Pt is a 88 y.o. male seen today for medical management of chronic diseases.  Patient's daughter is at bedside and aids with history.   still waking up wet despite medications ---myrbetriq and flomax. He states waking up wet impacts his mood more than one would think. He had a great 89th birthday party. States since his birthday he has nothing to look forward to.   for much of his adult life has had a degree of depression. discontinue coffee due to potential d/c asa, atorfvastatin, ropinrole , allegra,  decrease losartan to25 mg  Past Medical History:  Diagnosis Date   Benign prostatic hyperplasia with lower urinary tract symptoms    Per [facility]   Cellulitis of left lower limb    Per [facility]   Chronic kidney disease, stage 3b ([place])    Per [facility]   Depression, unspecified    Per [facility]   Frequent falls    GERD without esophagitis    Per [facility]    HLD (hyperlipidemia)    Per [facility]   Hypertension    Hypertensive chronic kidney disease with stage 1 through stage 4 chronic kidney disease, or unspecified chronic kidney disease    Per [facility]   Insomnia, unspecified    Per [facility]   Muscle weakness    Per [facility]   Need for assistance with personal care    Per [facility]   Other lack of coordination    Per [facility]   Other sequelae of other cerebrovascular disease    Per [facility]   Other specified anxiety disorders    Per [facility]   Other symptoms and signs involving cognitive functions and awareness    Per [facility]   Overactive bladder    Per [facility]   Personal history of malignant neoplasm of prostate    Per [facility]   Pressure ulcer of other site, stage 2 ([place])    Per [facility]   Pressure ulcer of right ankle, stage 2 ([place])    Per [facility]   Pressure ulcer of right ankle, unstageable ([place])    Per [facility]   Restless leg syndrome    Per [facility]   Shoulder dislocation    TIA (transient ischemic attack)    Transient cerebral ischemic attack, unspecified    Per [facility]   Unspecified macular degeneration    Per  [facility]   Urge incontinence    Per [facility]   Vascular dementia, unspecified severity, without behavioral disturbance, psychotic disturbance, mood disturbance, and anxiety ([place])    Per [facility]   Past Surgical History:  Procedure Laterality Date   APPENDECTOMY     HERNIA REPAIR      Allergies  Allergen Reactions   Doxycycline Hives, Rash and Other (See Comments)   Benzonatate Other (See Comments)   Horse-Derived Products Hives   Other Hives    HORSE SERUM   Simvastatin Other (See Comments)   Tetanus Toxoid Other (See Comments)    Tetanus (horse-serum based tetanus)- unknown  Other reaction(s): Unknown Tetanus (horse-serum based tetanus)- unknown  Other reaction(s): Unknown Tetanus (horse-serum based tetanus)- unknown Other reaction(s):  Unknown Tetanus (horse-serum based tetanus)- unknown    Codeine Nausea Only, Other (See Comments) and Nausea And Vomiting    GI upset     Outpatient Encounter Medications as of 02/25/2022  Medication Sig   acetaminophen (TYLENOL) 650 MG CR tablet Take 650 mg by mouth every 4 (four) hours as needed. And 1 tablet scheduled at bedtime daily   albuterol (VENTOLIN HFA) 108 (90 Base) MCG/ACT inhaler Inhale 2 puffs into the lungs every 4 (four) hours as needed.   ARIPiprazole (ABILIFY) 5 MG tablet Take 5 mg by mouth daily.   aspirin EC 81 MG tablet Take 81 mg by mouth daily. Swallow whole.   atorvastatin (LIPITOR) 40 MG tablet Take 40 mg by mouth at bedtime.   buPROPion (WELLBUTRIN XL) 150 MG 24 hr tablet Take 150 mg by mouth every morning.   Calcium Carb-Cholecalciferol (CALCIUM/VITAMIN D PO) Take 1 tablet by mouth daily.   carbamide peroxide (DEBROX) 6.5 % OTIC solution Place 5 drops into both ears as needed.   clobetasol cream (TEMOVATE) 4.23 % Apply 1 Application topically as needed. Apply to left ear   docusate sodium (COLACE) 100 MG capsule Take 100 mg by mouth 2 (two) times daily.   fexofenadine (ALLEGRA) 180 MG tablet fexofenadine 180 mg tablet  TAKE 1 TABLET (180 MG TOTAL) BY MOUTH ONCE DAILY. *OTC - NOT COVERED*   hydrocortisone cream 1 % Apply 1 Application topically every 12 (twelve) hours as needed for itching.   losartan (COZAAR) 50 MG tablet Take 1 tablet by mouth at bedtime.   magnesium hydroxide (MILK OF MAGNESIA) 400 MG/5ML suspension Take 30 mLs by mouth every 12 (twelve) hours as needed for mild constipation.   melatonin 5 MG TABS Take 5 mg by mouth daily. At bedtime   Multiple Vitamins-Minerals (PRESERVISION AREDS PO) Take 2 capsules by mouth in the morning and at bedtime.   MYRBETRIQ 50 MG TB24 tablet Take 50 mg by mouth daily.   ondansetron (ZOFRAN-ODT) 4 MG disintegrating tablet Take 4 mg by mouth every 8 (eight) hours as needed for nausea or vomiting.   Polyethyl  Glycol-Propyl Glycol (SYSTANE) 0.4-0.3 % SOLN Place 1 drop into both eyes every 4 (four) hours as needed.   polyethylene glycol (MIRALAX / GLYCOLAX) 17 g packet Take 17 g by mouth daily as needed for moderate constipation.   rOPINIRole (REQUIP) 0.5 MG tablet Take 0.5 mg by mouth at bedtime.   sertraline (ZOLOFT) 100 MG tablet Take 100 mg by mouth daily.   tamsulosin (FLOMAX) 0.4 MG CAPS capsule Take 0.4 mg by mouth.   No facility-administered encounter medications on file as of 02/25/2022.    Review of Systems  Constitutional: Negative.   Respiratory: Negative.  Cardiovascular: Negative.   Gastrointestinal: Negative.   Psychiatric/Behavioral:  Positive for dysphoric mood.   All other systems reviewed and are negative.  Immunization History  Administered Date(s) Administered   Influenza, High Dose Seasonal PF 04/12/2016, 02/22/2021   Influenza-Unspecified 02/21/2007, 02/25/2008, 02/20/2009, 03/24/2011, 02/21/2012, 01/21/2014, 04/04/2015, 04/12/2016, 03/22/2017, 02/28/2019   Moderna Covid-19 Vaccine Bivalent Booster 43yr & up 10/19/2021   Moderna SARS-COV2 Booster Vaccination 10/09/2020   Moderna Sars-Covid-2 Vaccination 06/10/2019, 07/08/2019   Pfizer Covid-19 Vaccine Bivalent Booster 146yr& up 02/12/2021   Pneumococcal Conjugate-13 02/11/2016, 06/10/2016   Pneumococcal Polysaccharide-23 07/12/1999, 01/22/2007, 02/11/2016   Tdap 10/19/2010, 04/23/2011, 02/11/2016   Zoster Recombinat (Shingrix) 12/08/2017   Zoster, Live 11/07/2014   Pertinent  Health Maintenance Due  Topic Date Due   INFLUENZA VACCINE  12/21/2021      12/30/2021    3:21 PM 12/30/2021    6:11 PM 12/31/2021   12:00 AM 12/31/2021   11:00 AM 12/31/2021    8:03 PM  Fall Risk  Patient Fall Risk Level Low fall risk High fall risk High fall risk High fall risk High fall risk   Functional Status Survey:    Vitals:   02/25/22 1113  BP: (!) 99/50  Pulse: 70  Resp: 16  Temp: 97.6 F (36.4 C)  SpO2: 97%   Weight: 145 lb (65.8 kg)  Height: '5\' 10"'$  (1.778 m)   Body mass index is 20.81 kg/m. Physical Exam Constitutional:      Comments: Keeps eyes closed throughout interaction. Intermittently states he can't follow conversation due to poor hearing.   Cardiovascular:     Pulses: Normal pulses.     Heart sounds: Normal heart sounds.  Pulmonary:     Effort: Pulmonary effort is normal.     Breath sounds: Normal breath sounds.  Abdominal:     General: Abdomen is flat.     Palpations: Abdomen is soft.  Skin:    General: Skin is warm and dry.  Neurological:     Mental Status: He is alert and oriented to person, place, and time.   Labs reviewed: Recent Labs    10/15/21 0000 12/30/21 1509 12/30/21 1515 12/31/21 0735  NA 137  --  135 137  K 4.4  4.4  --  4.1 3.9  CL 106  106  --  103 108  CO2 26*  26*  --  23 21*  GLUCOSE  --   --  103* 102*  BUN 45*  --  36* 35*  CREATININE 1.9* 2.10* 1.80* 1.62*  CALCIUM 8.9  --  8.7* 8.5*   Recent Labs    10/15/21 0000 12/30/21 1515  AST 19 28  ALT 15 24  ALKPHOS 44 60  BILITOT  --  0.6  PROT  --  6.6  ALBUMIN 3.4* 3.0*   Recent Labs    10/15/21 0000 12/30/21 1515  WBC 6.1 15.4*  NEUTROABS 3,605.00 11.2*  HGB 11.1* 10.4*  HCT 34* 33.0*  MCV  --  90.9  PLT 252 521*   No results found for: "TSH" Lab Results  Component Value Date   HGBA1C 5.3 12/30/2021   Lab Results  Component Value Date   CHOL 107 12/31/2021   HDL 26 (L) 12/31/2021   LDLCALC 57 12/31/2021   TRIG 122 12/31/2021   CHOLHDL 4.1 12/31/2021    Significant Diagnostic Results in last 30 days:  No results found.  Assessment/Plan 1. Essential hypertension BP overcontrolled at this time given low levels of 99/50.  We  will plan to decrease dose of losartan to 25 mg daily and continue to monitor blood pressure.  If blood pressures remain low we will plan to discontinue medication.  Discontinue aspirin given secondary prevention and patient's age.  We will also  discontinue atorvastatin at this time given patient's age and goals of care.  2. COPD, mild ([place]) Patient without symptoms at this time, continue albuterol as needed.  3. Moderate vascular dementia with anxiety ([place]) Patient has had weight loss over the last few months around 8%.  Discussed concern for weight loss and potential need for appetite stimulant if desired.  Patient already on protein supplement with [employer].  We will also evaluate patient's need for supportive feeding.  Family's goal is to address patient's decreased hunger drive however discussed this can be an aspect of aging.  We will perform EKG today if QTc is within normal range we will plan to start mirtazapine 7.5 mg for 1 month and then follow-up if there is an increase in hunger.  Also discussed potential side effects of increased drowsiness during the day which can also affect patient's feeding.  4. Sensorineural hearing loss, bilateral Patient wears hearing aids continues to have trouble with hearing despite presents.    5. Stage 3 chronic kidney disease, unspecified whether stage 3a or 3b CKD ([place]) Patient without signs of changes in kidney function without itching or changes in mental status.  Most recent labs earlier this year showed stable kidney function.  We will plan for BMP every 3 to 6 months based on stability.  6. Overactive bladder Patient continues to have symptoms with overactive bladder despite Flomax and Myrbetriq.  Discussed lifestyle changes that can help such as decreasing coffee intake and water after certain time.  Discussed the challenges we have given patient's need to maintain hydration and given low blood pressures.  Discussed potential discontinuation of Myrbetriq given potential delirium and anticholinergic effects of urinary retention.  Will defer discontinuation at this time.  Continue Myrbetriq 50 mg daily and Flomax 0.4 mg nightly.  7. RLS (restless legs syndrome) Patient has been without  symptoms for some time, discussed possibility of improved symptoms with iron supplementation instead of ropinirole.  We will plan to discontinue ropinirole will defer iron treatment based on lab findings.  8. Bipolar 1 disorder ([place]) Patient's mood has been quite low despite happiness with his most recent birthday celebration.  Patient is currently on aripiprazole 5 mg, bupropion 100 mg, and sertraline 100 mg.  Discussed potential increase of sertraline, however family is also interested in trialing a medication that can cause Aurax agenic effect such as mirtazapine.  We will plan for EKG today to determine QTc if within normal range we will plan to start mirtazapine 7.5 mg.  If no improvement will plan to discontinue and adjust sertraline dosing accordingly.  Discussed the possibility of palliative care for further discussion of goals of care however was declined at this time.  Patient's dementia has not progressed to the point of requiring hospice, however could consider this a admitting diagnosis based on family's goals of care at follow-up evaluations..  9. DNR (do not resuscitate) Patient maintains he is a DNR status this is no change despite his mood.    10. Severe episode of recurrent major depressive disorder, without psychotic features ([place]) Outlined above under bipolar disorder.    Family/ staff Communication: Updated daughter and nursing staff.   Labs/tests ordered: CBC and BMP.  I spent greater than 45 minutes in face-to-face  time, chart review, documentation, and patient education with this patient and family.  Tomasa Rand, MD, [facility] [phone]

## 2022-02-27 MED ORDER — LOSARTAN POTASSIUM 25 MG PO TABS: 25.0000 mg | ORAL_TABLET | Freq: Every day | ORAL | Status: DC

## 2022-02-28 ENCOUNTER — Non-Acute Institutional Stay (SKILLED_NURSING_FACILITY): Payer: Medicare Other | Admitting: Student

## 2022-02-28 ENCOUNTER — Encounter: Payer: Self-pay | Admitting: Student

## 2022-02-28 DIAGNOSIS — L89512 Pressure ulcer of right ankle, stage 2: Secondary | ICD-10-CM

## 2022-02-28 DIAGNOSIS — I10 Essential (primary) hypertension: Secondary | ICD-10-CM

## 2022-02-28 DIAGNOSIS — L89312 Pressure ulcer of right buttock, stage 2: Secondary | ICD-10-CM | POA: Diagnosis not present

## 2022-02-28 NOTE — Progress Notes (Signed)
Location:  Other [facility]) Nursing Home Room Number: 263-F Place of Service:  SNF [phone]) Provider:  Dewayne Shorter, MD  Patient Care Team: Dewayne Shorter, MD as PCP - General [facility])  Extended Emergency Contact Information Primary Emergency Contact: Mantegna,Sarah Address: [place] [address] Johnnette Litter of [place] Phone: [phone] Mobile Phone: [phone] Relation: Daughter Secondary Emergency Contact: Balinda Quails Address: [address]          [place], [place] [address] Johnnette Litter of [place] Phone: [phone] Relation: Spouse  Code Status:  DNR Goals of care: Advanced Directive information    02/25/2022   11:15 AM  Advanced Directives  Does Patient Have a Medical Advance Directive? Yes  Type of Advance Directive Out of facility DNR (pink MOST or yellow form)  Does patient want to make changes to medical advance directive? No - Patient declined     Chief Complaint  Patient presents with   Acute Visit    Pressure wound , Vitals and medications are a reflection of [facility], [facility]      HPI:  Pt is a 88 y.o. male seen today for an acute visit for follow up of wounds. Patient continues to have slightly low blood pressure and had a fall from his wheelchair this weekend. No pain with wounds.    Past Medical History:  Diagnosis Date   Benign prostatic hyperplasia with lower urinary tract symptoms    Per [facility]   Cellulitis of left lower limb    Per [facility]   Chronic kidney disease, stage 3b ([place])    Per [facility]   Depression, unspecified    Per [facility]   Frequent falls    GERD without esophagitis    Per [facility]   HLD (hyperlipidemia)    Per [facility]   Hypertension    Hypertensive chronic kidney disease with stage 1 through stage 4 chronic kidney disease, or unspecified chronic kidney disease    Per [facility]   Insomnia, unspecified     Per [facility]   Muscle weakness    Per [facility]   Need for assistance with personal care    Per [facility]   Other lack of coordination    Per [facility]   Other sequelae of other cerebrovascular disease    Per [facility]   Other specified anxiety disorders    Per [facility]   Other symptoms and signs involving cognitive functions and awareness    Per [facility]   Overactive bladder    Per [facility]   Personal history of malignant neoplasm of prostate    Per [facility]   Pressure ulcer of other site, stage 2 ([place])    Per [facility]   Pressure ulcer of right ankle, stage 2 ([place])    Per [facility]   Pressure ulcer of right ankle, unstageable ([place])    Per [facility]   Restless leg syndrome    Per [facility]   Shoulder dislocation    TIA (transient ischemic attack)    Transient cerebral ischemic attack, unspecified    Per [facility]   Unspecified macular degeneration    Per [facility]   Urge incontinence    Per [facility]   Vascular dementia, unspecified severity, without behavioral disturbance, psychotic disturbance, mood disturbance, and anxiety ([place])    Per  [facility]   Past Surgical History:  Procedure Laterality Date   APPENDECTOMY     HERNIA REPAIR      Allergies  Allergen Reactions   Doxycycline Hives, Rash and Other (See Comments)   Benzonatate Other (See Comments)   Horse-Derived Products Hives   Other Hives    HORSE SERUM   Simvastatin Other (See Comments)   Tetanus Toxoid Other (See Comments)    Tetanus (horse-serum based tetanus)- unknown  Other reaction(s): Unknown Tetanus (horse-serum based tetanus)- unknown  Other reaction(s): Unknown Tetanus (horse-serum based tetanus)- unknown Other reaction(s): Unknown Tetanus (horse-serum based tetanus)- unknown    Codeine Nausea Only, Other (See Comments) and Nausea And Vomiting    GI upset     Outpatient Encounter Medications as of 02/28/2022  Medication Sig   acetaminophen (TYLENOL) 650 MG CR  tablet Take 650 mg by mouth every 4 (four) hours as needed. And 1 tablet scheduled at bedtime daily   albuterol (VENTOLIN HFA) 108 (90 Base) MCG/ACT inhaler Inhale 2 puffs into the lungs every 4 (four) hours as needed.   ARIPiprazole (ABILIFY) 5 MG tablet Take 5 mg by mouth daily.   buPROPion (WELLBUTRIN XL) 150 MG 24 hr tablet Take 150 mg by mouth every morning.   Calcium Carb-Cholecalciferol (CALCIUM/VITAMIN D PO) Take 1 tablet by mouth daily.   carbamide peroxide (DEBROX) 6.5 % OTIC solution Place 5 drops into both ears as needed.   clobetasol cream (TEMOVATE) 2.50 % Apply 1 Application topically as needed. Apply to left ear   docusate sodium (COLACE) 100 MG capsule Take 100 mg by mouth 2 (two) times daily.   ferrous sulfate 325 (65 FE) MG EC tablet Take 325 mg by mouth every other day.   hydrocortisone cream 1 % Apply 1 Application topically every 12 (twelve) hours as needed for itching.   losartan (COZAAR) 25 MG tablet Take 1 tablet (25 mg total) by mouth daily.   magnesium hydroxide (MILK OF MAGNESIA) 400 MG/5ML suspension Take 30 mLs by mouth every 12 (twelve) hours as needed for mild constipation.   melatonin 5 MG TABS Take 5 mg by mouth daily. At bedtime   mirtazapine (REMERON) 7.5 MG tablet Take 7.5 mg by mouth at bedtime.   Multiple Vitamins-Minerals (PRESERVISION AREDS PO) Take 2 capsules by mouth in the morning and at bedtime.   MYRBETRIQ 50 MG TB24 tablet Take 50 mg by mouth daily.   ondansetron (ZOFRAN-ODT) 4 MG disintegrating tablet Take 4 mg by mouth every 8 (eight) hours as needed for nausea or vomiting.   Polyethyl Glycol-Propyl Glycol (SYSTANE) 0.4-0.3 % SOLN Place 1 drop into both eyes every 4 (four) hours as needed.   polyethylene glycol (MIRALAX / GLYCOLAX) 17 g packet Take 17 g by mouth daily as needed for moderate constipation.   sertraline (ZOLOFT) 100 MG tablet Take 100 mg by mouth daily.   tamsulosin (FLOMAX) 0.4 MG CAPS capsule Take 0.4 mg by mouth.   No  facility-administered encounter medications on file as of 02/28/2022.    Review of Systems  All other systems reviewed and are negative.   Immunization History  Administered Date(s) Administered   Influenza, High Dose Seasonal PF 04/12/2016, 02/22/2021   Influenza-Unspecified 02/21/2007, 02/25/2008, 02/20/2009, 03/24/2011, 02/21/2012, 01/21/2014, 04/04/2015, 04/12/2016, 03/22/2017, 02/28/2019   Moderna Covid-19 Vaccine Bivalent Booster 82yr & up 10/19/2021   Moderna SARS-COV2 Booster Vaccination 10/09/2020   Moderna Sars-Covid-2 Vaccination 06/10/2019, 07/08/2019   Pfizer Covid-19 Vaccine Bivalent Booster 168yr& up 02/12/2021   Pneumococcal  Conjugate-13 02/11/2016, 06/10/2016   Pneumococcal Polysaccharide-23 07/12/1999, 01/22/2007, 02/11/2016   Tdap 10/19/2010, 04/23/2011, 02/11/2016   Zoster Recombinat (Shingrix) 12/08/2017   Zoster, Live 11/07/2014   Pertinent  Health Maintenance Due  Topic Date Due   INFLUENZA VACCINE  12/21/2021      12/30/2021    3:21 PM 12/30/2021    6:11 PM 12/31/2021   12:00 AM 12/31/2021   11:00 AM 12/31/2021    8:03 PM  Fall Risk  Patient Fall Risk Level Low fall risk High fall risk High fall risk High fall risk High fall risk   Functional Status Survey:    Vitals:   02/28/22 1609  BP: (!) 99/50  Pulse: 70  Resp: 16  Temp: 97.6 F (36.4 C)  SpO2: 97%  Weight: 145 lb (65.8 kg)  Height: '5\' 10"'$  (1.778 m)   Body mass index is 20.81 kg/m. Physical Exam Cardiovascular:     Pulses: Normal pulses.  Pulmonary:     Effort: Pulmonary effort is normal.  Skin:    Comments: Photo below  Neurological:     Mental Status: He is alert.       Labs reviewed: Recent Labs    10/15/21 0000 12/30/21 1509 12/30/21 1515 12/31/21 0735  NA 137  --  135 137  K 4.4  4.4  --  4.1 3.9  CL 106  106  --  103 108  CO2 26*  26*  --  23 21*  GLUCOSE  --   --  103* 102*  BUN 45*  --  36* 35*  CREATININE 1.9* 2.10* 1.80* 1.62*  CALCIUM 8.9  --  8.7*  8.5*   Recent Labs    10/15/21 0000 12/30/21 1515  AST 19 28  ALT 15 24  ALKPHOS 44 60  BILITOT  --  0.6  PROT  --  6.6  ALBUMIN 3.4* 3.0*   Recent Labs    10/15/21 0000 12/30/21 1515  WBC 6.1 15.4*  NEUTROABS 3,605.00 11.2*  HGB 11.1* 10.4*  HCT 34* 33.0*  MCV  --  90.9  PLT 252 521*   No results found for: "TSH" Lab Results  Component Value Date   HGBA1C 5.3 12/30/2021   Lab Results  Component Value Date   CHOL 107 12/31/2021   HDL 26 (L) 12/31/2021   LDLCALC 57 12/31/2021   TRIG 122 12/31/2021   CHOLHDL 4.1 12/31/2021    Significant Diagnostic Results in last 30 days:  No results found.  Assessment/Plan 1. Pressure injury of right ankle, stage 2 ([place]) Patient with healing pressure injury of the right ankle. No longer needs med-honey. Will continue padded dressing.    2. Pressure injury of right buttock, stage 2 (H[facility]) Healing pressure wound. Wound previously with slough present. No longer needs mupirocin, will start calcium alginate daily with padded dressing.   3. Primary hypertension BP has been below goal, will plan for discontinuation of blood pressure medications.    Family/ staff Communication: nursing staff updated  Labs/tests ordered:  none  Tomasa Rand, MD, [facility] [phone]

## 2022-03-10 LAB — BASIC METABOLIC PANEL
BUN: 39 — AB (ref 4–21)
CO2: 26 — AB (ref 13–22)
Chloride: 105 (ref 99–108)
Creatinine: 1.5 — AB (ref 0.6–1.3)
Glucose: 76
Potassium: 4.4 mEq/L (ref 3.5–5.1)
Sodium: 138 (ref 137–147)

## 2022-03-10 LAB — CBC AND DIFFERENTIAL
HCT: 25 — AB (ref 41–53)
Hemoglobin: 7.6 — AB (ref 13.5–17.5)
Platelets: 499 10*3/uL — AB (ref 150–400)
WBC: 9.4

## 2022-03-10 LAB — CBC: RBC: 2.85 — AB (ref 3.87–5.11)

## 2022-03-10 LAB — COMPREHENSIVE METABOLIC PANEL: Calcium: 8.3 — AB (ref 8.7–10.7)

## 2022-03-18 ENCOUNTER — Non-Acute Institutional Stay (SKILLED_NURSING_FACILITY): Payer: Medicare Other | Admitting: Student

## 2022-03-18 ENCOUNTER — Encounter: Payer: Self-pay | Admitting: Student

## 2022-03-18 DIAGNOSIS — F01B4 Vascular dementia, moderate, with anxiety: Secondary | ICD-10-CM | POA: Diagnosis not present

## 2022-03-18 DIAGNOSIS — M1990 Unspecified osteoarthritis, unspecified site: Secondary | ICD-10-CM | POA: Diagnosis not present

## 2022-03-18 NOTE — Progress Notes (Unsigned)
Location:  Other [facility]) Nursing Home Room Number: 382-N Place of Service:  SNF [phone]) Provider:  Dewayne Shorter, MD  Patient Care Team: Dewayne Shorter, MD as PCP - General [facility] Medicine)  Extended Emergency Contact Information Primary Emergency Contact: Mantegna,Sarah Address: [place] [address] [name] Phone: [phone] Mobile Phone: [phone] Relation: Daughter Secondary Emergency Contact: Balinda Quails Address: [address]          [place], [place] [address] [name] Phone: [phone] Relation: Spouse  Code Status:  DNR Goals of care: Advanced Directive information    02/25/2022   11:15 AM  Advanced Directives  Does Patient Have a Medical Advance Directive? Yes  Type of Advance Directive Out of facility DNR (pink MOST or yellow form)  Does patient want to make changes to medical advance directive? No - Patient declined     Chief Complaint  Patient presents with   Acute Visit    Hand swelling. Vitals and medications are a reflection of [facility] system, [facility]      HPI:  Pt is a 88 y.o. male seen today for an acute visit for nurse report of left hand swelling. Patient is sleeping peacefully in his recliner. Denies pain. Is oriented to self and situation when aroused.    Past Medical History:  Diagnosis Date   Benign prostatic hyperplasia with lower urinary tract symptoms    Per [facility]   Cellulitis of left lower limb    Per [facility]   Chronic kidney disease, stage 3b ([place])    Per [facility]   Depression, unspecified    Per [facility]   Frequent falls    GERD without esophagitis    Per [facility]   HLD (hyperlipidemia)    Per [facility]   Hypertension    Hypertensive chronic kidney disease with stage 1 through stage 4 chronic kidney disease, or unspecified chronic kidney disease    Per [facility]   Insomnia, unspecified    Per  [facility]   Muscle weakness    Per [facility]   Need for assistance with personal care    Per [facility]   Other lack of coordination    Per [facility]   Other sequelae of other cerebrovascular disease    Per [facility]   Other specified anxiety disorders    Per [facility]   Other symptoms and signs involving cognitive functions and awareness    Per [facility]   Overactive bladder    Per [facility]   Personal history of malignant neoplasm of prostate    Per [facility]   Pressure ulcer of other site, stage 2 ([place])    Per [facility]   Pressure ulcer of right ankle, stage 2 ([place])    Per [facility]   Pressure ulcer of right ankle, unstageable ([employer])    Per [facility]   Restless leg syndrome    Per [facility]   Shoulder dislocation    TIA (transient ischemic attack)    Transient cerebral ischemic attack, unspecified    Per [facility]   Unspecified macular degeneration    Per [employer] [facility]   Urge incontinence    Per [facility]   Vascular dementia, unspecified severity, without behavioral disturbance, psychotic disturbance, mood disturbance, and anxiety ([place])    Per [facility]  Past Surgical History:  Procedure Laterality Date   APPENDECTOMY     HERNIA REPAIR      Allergies  Allergen Reactions   Doxycycline Hives, Rash and Other (See Comments)   Benzonatate Other (See Comments)   Horse-Derived Products Hives   Other Hives    HORSE SERUM   Simvastatin Other (See Comments)   Tetanus Toxoid Other (See Comments)    Tetanus (horse-serum based tetanus)- unknown  Other reaction(s): Unknown Tetanus (horse-serum based tetanus)- unknown  Other reaction(s): Unknown Tetanus (horse-serum based tetanus)- unknown Other reaction(s): Unknown Tetanus (horse-serum based tetanus)- unknown    Codeine Nausea Only, Other (See Comments) and Nausea And Vomiting    GI upset     Outpatient Encounter Medications as of 03/18/2022  Medication Sig   acetaminophen (TYLENOL) 650 MG CR  tablet Take 650 mg by mouth every 4 (four) hours as needed. And 1 tablet scheduled at bedtime daily   albuterol (VENTOLIN HFA) 108 (90 Base) MCG/ACT inhaler Inhale 2 puffs into the lungs every 4 (four) hours as needed.   ARIPiprazole (ABILIFY) 5 MG tablet Take 5 mg by mouth daily.   buPROPion (WELLBUTRIN XL) 150 MG 24 hr tablet Take 150 mg by mouth every morning.   Calcium Carb-Cholecalciferol (CALCIUM/VITAMIN D PO) Take 1 tablet by mouth daily.   carbamide peroxide (DEBROX) 6.5 % OTIC solution Place 5 drops into both ears as needed.   clobetasol cream (TEMOVATE) 8.85 % Apply 1 Application topically as needed. Apply to left ear   docusate sodium (COLACE) 100 MG capsule Take 100 mg by mouth 2 (two) times daily.   ferrous sulfate 325 (65 FE) MG EC tablet Take 325 mg by mouth every other day.   hydrocortisone cream 1 % Apply 1 Application topically every 12 (twelve) hours as needed for itching.   magnesium hydroxide (MILK OF MAGNESIA) 400 MG/5ML suspension Take 30 mLs by mouth every 12 (twelve) hours as needed for mild constipation.   melatonin 5 MG TABS Take 5 mg by mouth daily. At bedtime   mirtazapine (REMERON) 7.5 MG tablet Take 7.5 mg by mouth at bedtime.   Multiple Vitamins-Minerals (PRESERVISION AREDS PO) Take 2 capsules by mouth in the morning and at bedtime.   MYRBETRIQ 50 MG TB24 tablet Take 50 mg by mouth daily.   ondansetron (ZOFRAN-ODT) 4 MG disintegrating tablet Take 4 mg by mouth every 8 (eight) hours as needed for nausea or vomiting.   Polyethyl Glycol-Propyl Glycol (SYSTANE) 0.4-0.3 % SOLN Place 1 drop into both eyes every 4 (four) hours as needed.   polyethylene glycol (MIRALAX / GLYCOLAX) 17 g packet Take 17 g by mouth daily as needed for moderate constipation.   sertraline (ZOLOFT) 100 MG tablet Take 100 mg by mouth daily.   tamsulosin (FLOMAX) 0.4 MG CAPS capsule Take 0.4 mg by mouth.   [DISCONTINUED] losartan (COZAAR) 25 MG tablet Take 1 tablet (25 mg total) by mouth daily.    No facility-administered encounter medications on file as of 03/18/2022.    Review of Systems  All other systems reviewed and are negative.   Immunization History  Administered Date(s) Administered   Influenza, High Dose Seasonal PF 04/12/2016, 02/22/2021, 03/08/2022   Influenza-Unspecified 02/21/2007, 02/25/2008, 02/20/2009, 03/24/2011, 02/21/2012, 01/21/2014, 04/04/2015, 04/12/2016, 03/22/2017, 02/28/2019   Moderna Covid-19 Vaccine Bivalent Booster 2yr & up 10/19/2021   Moderna SARS-COV2 Booster Vaccination 10/09/2020   Moderna Sars-Covid-2 Vaccination 06/10/2019, 07/08/2019   Pfizer Covid-19 Vaccine Bivalent Booster 130yr& up 02/12/2021   Pneumococcal Conjugate-13 02/11/2016,  06/10/2016   Pneumococcal Polysaccharide-23 07/12/1999, 01/22/2007, 02/11/2016   Tdap 10/19/2010, 04/23/2011, 02/11/2016   Zoster Recombinat (Shingrix) 12/08/2017   Zoster, Live 11/07/2014   Pertinent  Health Maintenance Due  Topic Date Due   INFLUENZA VACCINE  Completed      12/30/2021    3:21 PM 12/30/2021    6:11 PM 12/31/2021   12:00 AM 12/31/2021   11:00 AM 12/31/2021    8:03 PM  Fall Risk  Patient Fall Risk Level Low fall risk High fall risk High fall risk High fall risk High fall risk   Functional Status Survey:    Vitals:   03/18/22 1445  BP: (!) 107/57  Pulse: 74  Resp: 20  Temp: 97.8 F (36.6 C)  SpO2: 97%  Weight: 137 lb (62.1 kg)  Height: '5\' 10"'$  (1.778 m)   Body mass index is 19.66 kg/m. Physical Exam Constitutional:      Comments: Frail, thin  Musculoskeletal:     Comments: Left hand with normal appearance, 4/5 strength bilateral upper extremities.   Neurological:     Mental Status: Mental status is at baseline.     Labs reviewed: Recent Labs    12/30/21 1515 12/31/21 0735 03/10/22 0000  NA 135 137 138  K 4.1 3.9 4.4  CL 103 108 105  CO2 23 21* 26*  GLUCOSE 103* 102*  --   BUN 36* 35* 39*  CREATININE 1.80* 1.62* 1.5*  CALCIUM 8.7* 8.5* 8.3*   Recent  Labs    10/15/21 0000 12/30/21 1515  AST 19 28  ALT 15 24  ALKPHOS 44 60  BILITOT  --  0.6  PROT  --  6.6  ALBUMIN 3.4* 3.0*   Recent Labs    10/15/21 0000 12/30/21 1515 03/10/22 0000  WBC 6.1 15.4* 9.4  NEUTROABS 3,605.00 11.2*  --   HGB 11.1* 10.4* 7.6*  HCT 34* 33.0* 25*  MCV  --  90.9  --   PLT 252 521* 499*   No results found for: "TSH" Lab Results  Component Value Date   HGBA1C 5.3 12/30/2021   Lab Results  Component Value Date   CHOL 107 12/31/2021   HDL 26 (L) 12/31/2021   LDLCALC 57 12/31/2021   TRIG 122 12/31/2021   CHOLHDL 4.1 12/31/2021    Significant Diagnostic Results in last 30 days:  No results found.  Assessment/Plan 1. Arthritis Patient's nurse stated hand is swollen. On my exam patient's hand was malpositioned and may have given this appearance. No pain per patient. No increased warmth or swelling. Will continue to monitor.   2. Moderate vascular dementia with anxiety [facility]) Patient continues to have weight loss. He has declined numerous interventions such as nurse-assisted feedings, and consistent intake of protein supplementation. Concern for his decline given his weightless. Will continue supportive care as we are able. Patient was recently started on mirtazapine, however, it appears he may be more sleepy with this medication than eating. Will continue goals of care conversations with patient's family based on these findings. Will discuss consideration for hospice and maintain patient's comfort rather than aggressive treatment. Patient's skin failure with numerous wounds is indicative of his poor nutrition and continued weight loss-- they are at vulnerable locations at his bony prominence.    Family/ staff Communication: nurse, plan to call his daughter early next week to discuss or via mychart if most appropriate.   Labs/tests ordered:  none  Tomasa Rand, MD, [facility], [facility] [phone]

## 2022-03-20 ENCOUNTER — Encounter: Payer: Self-pay | Admitting: Student

## 2022-03-21 NOTE — Telephone Encounter (Signed)
Message routed to PCP Beamer, Victoria, MD  

## 2022-03-22 ENCOUNTER — Other Ambulatory Visit: Payer: Self-pay | Admitting: Nurse Practitioner

## 2022-03-22 DIAGNOSIS — F01B4 Vascular dementia, moderate, with anxiety: Secondary | ICD-10-CM

## 2022-03-25 ENCOUNTER — Other Ambulatory Visit: Payer: Self-pay

## 2022-03-25 ENCOUNTER — Other Ambulatory Visit: Payer: Self-pay | Admitting: Student

## 2022-03-25 DIAGNOSIS — Z515 Encounter for palliative care: Secondary | ICD-10-CM

## 2022-03-25 MED ORDER — LORAZEPAM 0.5 MG PO TABS: 0.5000 mg | ORAL_TABLET | Freq: Three times a day (TID) | ORAL | 0 refills | Status: DC | PRN

## 2022-03-25 MED ORDER — MORPHINE SULFATE (CONCENTRATE) 20 MG/ML PO SOLN: 5.0000 mg | ORAL | 0 refills | Status: AC | PRN

## 2022-03-25 MED ORDER — LORAZEPAM 0.5 MG PO TABS: 0.5000 mg | ORAL_TABLET | Freq: Three times a day (TID) | ORAL | 0 refills | Status: AC | PRN

## 2022-03-25 NOTE — Telephone Encounter (Signed)
Prescription was sent in,but was not accepted electronically due to invalid prescriber dea number (pharmacy note). Medication needs to be resent or called in verbally,  Medication pended and sent to Dr. Dewayne Shorter

## 2022-03-25 NOTE — Progress Notes (Signed)
Patient now on hospice - will fill morphine and ativan for comfort measures. At this time taking his medications will, will defer discontinuing all meds at this time.   Tomasa Rand, MD, [facility] [phone]

## 2022-03-28 ENCOUNTER — Non-Acute Institutional Stay (SKILLED_NURSING_FACILITY): Payer: Medicare Other | Admitting: Student

## 2022-03-28 ENCOUNTER — Encounter: Payer: Self-pay | Admitting: Student

## 2022-03-28 DIAGNOSIS — Z515 Encounter for palliative care: Secondary | ICD-10-CM | POA: Diagnosis not present

## 2022-03-28 NOTE — Progress Notes (Signed)
Location:  Other [facility]) Nursing Home Room Number: 413-K Place of Service:  SNF [phone]) Provider:  Dewayne Shorter, MD  Patient Care Team: Dewayne Shorter, MD as PCP - General [facility] Medicine)  Extended Emergency Contact Information Primary Emergency Contact: Mantegna,Sarah Address: [place] [address] Johnnette Litter of [place] Phone: [phone] Mobile Phone: [phone] Relation: Daughter Secondary Emergency Contact: Balinda Quails Address: [address]          [place], [place] [address] Johnnette Litter of [place] Phone: [phone] Relation: Spouse  Code Status:  DNR Goals of care: Advanced Directive information    03/28/2022   10:40 AM  Advanced Directives  Does Patient Have a Medical Advance Directive? Yes  Type of Advance Directive Out of facility DNR (pink MOST or yellow form)  Does patient want to make changes to medical advance directive? No - Patient declined  Pre-existing out of facility DNR order (yellow form or pink MOST form) Yellow form placed in chart (order not valid for inpatient use)     Chief Complaint  Patient presents with   Acute Visit    End of life Care. Vitals and medications are a reflection of [facility] system, [facility]      HPI:  Pt is a 88 y.o. male seen today for an acute visit for end of life care. Patient has been less interactive for the last 48 hours.   Patient opens his eyes to voice, otherwise, unable to make out full words.   Called his daughter, Judson Roch, who is HCPOA. Discussed concern that he is starting to transition and the family should gather if they would like to be present. Request for family members to receive phone call.    Past Medical History:  Diagnosis Date   Benign prostatic hyperplasia with lower urinary tract symptoms    Per [facility]   Cellulitis of left lower limb    Per [facility]   Chronic kidney disease, stage 3b ([place])    Per [facility]   Depression, unspecified    Per [facility]   Frequent falls    GERD without esophagitis    Per [facility]   HLD (hyperlipidemia)    Per [facility]   Hypertension    Hypertensive chronic kidney disease with stage 1 through stage 4 chronic kidney disease, or unspecified chronic kidney disease    Per [facility]   Insomnia, unspecified    Per [facility]   Muscle weakness    Per [facility]   Need for assistance with personal care    Per [facility]   Other lack of coordination    Per [facility]   Other sequelae of other cerebrovascular disease    Per [facility]   Other specified anxiety disorders    Per [facility]   Other symptoms and signs involving cognitive functions and awareness    Per [facility]   Overactive bladder    Per [facility]   Personal history of malignant neoplasm of prostate    Per [facility]   Pressure ulcer of other site, stage 2 ([place])    Per [facility]   Pressure ulcer of right ankle, stage 2 ([place])    Per [facility]   Pressure ulcer of right ankle, unstageable ([place])    Per [facility]   Restless leg syndrome    Per [facility]  Shoulder dislocation    TIA (transient ischemic attack)    Transient cerebral ischemic attack, unspecified    Per [facility]   Unspecified macular degeneration    Per [facility]   Urge incontinence    Per [facility]   Vascular dementia, unspecified severity, without behavioral disturbance, psychotic disturbance, mood disturbance, and anxiety ([place])    Per [facility]   Past Surgical History:  Procedure Laterality Date   APPENDECTOMY     HERNIA REPAIR      Allergies  Allergen Reactions   Doxycycline Hives, Rash and Other (See Comments)   Benzonatate Other (See Comments)   Horse-Derived Products Hives   Other Hives    HORSE SERUM   Simvastatin Other (See Comments)   Tetanus Toxoid Other (See Comments)    Tetanus (horse-serum based tetanus)- unknown  Other reaction(s): Unknown Tetanus (horse-serum based tetanus)-  unknown  Other reaction(s): Unknown Tetanus (horse-serum based tetanus)- unknown Other reaction(s): Unknown Tetanus (horse-serum based tetanus)- unknown    Codeine Nausea Only, Other (See Comments) and Nausea And Vomiting    GI upset     Outpatient Encounter Medications as of 03/28/2022  Medication Sig   acetaminophen (TYLENOL) 650 MG CR tablet Take 650 mg by mouth every 4 (four) hours as needed. And 1 tablet scheduled at bedtime daily   albuterol (VENTOLIN HFA) 108 (90 Base) MCG/ACT inhaler Inhale 2 puffs into the lungs every 4 (four) hours as needed.   ARIPiprazole (ABILIFY) 5 MG tablet Take 5 mg by mouth daily.   buPROPion (WELLBUTRIN XL) 150 MG 24 hr tablet Take 150 mg by mouth every morning.   Calcium Carb-Cholecalciferol (CALCIUM/VITAMIN D PO) Take 1 tablet by mouth daily.   carbamide peroxide (DEBROX) 6.5 % OTIC solution Place 5 drops into both ears as needed.   clobetasol cream (TEMOVATE) 4.25 % Apply 1 Application topically as needed. Apply to left ear   docusate sodium (COLACE) 100 MG capsule Take 100 mg by mouth 2 (two) times daily.   hydrocortisone cream 1 % Apply 1 Application topically every 12 (twelve) hours as needed for itching.   LORazepam (ATIVAN) 0.5 MG tablet Take 1 tablet (0.5 mg total) by mouth every 8 (eight) hours as needed for anxiety (agitation).   magnesium hydroxide (MILK OF MAGNESIA) 400 MG/5ML suspension Take 30 mLs by mouth every 12 (twelve) hours as needed for mild constipation.   melatonin 5 MG TABS Take 5 mg by mouth daily. At bedtime   morphine (ROXANOL) 20 MG/ML concentrated solution Take 0.25 mLs (5 mg total) by mouth every 2 (two) hours as needed for severe pain, shortness of breath, breakthrough pain or moderate pain.   MYRBETRIQ 50 MG TB24 tablet Take 50 mg by mouth daily.   Nutritional Supplements (ENSURE CLEAR PO) Take 1 Can by mouth 2 (two) times daily.   ondansetron (ZOFRAN-ODT) 4 MG disintegrating tablet Take 4 mg by mouth every 8 (eight)  hours as needed for nausea or vomiting.   OXYGEN Inhale 2-4 L into the lungs as needed.   Polyethyl Glycol-Propyl Glycol (SYSTANE) 0.4-0.3 % SOLN Place 1 drop into both eyes every 4 (four) hours as needed.   polyethylene glycol (MIRALAX / GLYCOLAX) 17 g packet Take 17 g by mouth daily as needed for moderate constipation.   Protein (PROSOURCE PO) Take 1 Can by mouth daily. For wound healing   sertraline (ZOLOFT) 100 MG tablet Take 100 mg by mouth daily.   tamsulosin (FLOMAX) 0.4 MG CAPS capsule Take 0.4 mg  by mouth.   [DISCONTINUED] ferrous sulfate 325 (65 FE) MG EC tablet Take 325 mg by mouth every other day.   [DISCONTINUED] mirtazapine (REMERON) 7.5 MG tablet Take 7.5 mg by mouth at bedtime.   [DISCONTINUED] Multiple Vitamins-Minerals (PRESERVISION AREDS PO) Take 2 capsules by mouth in the morning and at bedtime.   No facility-administered encounter medications on file as of 03/28/2022.    Review of Systems  Unable to perform ROS: Mental status change    Immunization History  Administered Date(s) Administered   Influenza, High Dose Seasonal PF 04/12/2016, 02/22/2021, 03/08/2022   Influenza-Unspecified 02/21/2007, 02/25/2008, 02/20/2009, 03/24/2011, 02/21/2012, 01/21/2014, 04/04/2015, 04/12/2016, 03/22/2017, 02/28/2019   Moderna Covid-19 Vaccine Bivalent Booster 66yr & up 10/19/2021   Moderna SARS-COV2 Booster Vaccination 10/09/2020   Moderna Sars-Covid-2 Vaccination 06/10/2019, 07/08/2019   Pfizer Covid-19 Vaccine Bivalent Booster 158yr& up 02/12/2021   Pneumococcal Conjugate-13 02/11/2016, 06/10/2016   Pneumococcal Polysaccharide-23 07/12/1999, 01/22/2007, 02/11/2016   Tdap 10/19/2010, 04/23/2011, 02/11/2016   Zoster Recombinat (Shingrix) 12/08/2017   Zoster, Live 11/07/2014   Pertinent  Health Maintenance Due  Topic Date Due   INFLUENZA VACCINE  Completed      12/30/2021    3:21 PM 12/30/2021    6:11 PM 12/31/2021   12:00 AM 12/31/2021   11:00 AM 12/31/2021    8:03 PM  Fall  Risk  Patient Fall Risk Level Low fall risk High fall risk High fall risk High fall risk High fall risk   Functional Status Survey:    Vitals:   03/28/22 1038  BP: (!) 107/57  Pulse: 74  Resp: 16  Temp: 97.8 F (36.6 C)  SpO2: (!) 79%  Weight: 137 lb (62.1 kg)  Height: '5\' 10"'$  (1.778 m)   Body mass index is 19.66 kg/m. Physical Exam Constitutional:      Comments: Cachectic, sleeping comfortably in bed.  HENT:     Mouth/Throat:     Mouth: Mucous membranes are dry.  Cardiovascular:     Rate and Rhythm: Normal rate.     Pulses: Normal pulses.  Pulmonary:     Comments: Bilateral rhonchi, no increased work of breathing.  Abdominal:     General: Abdomen is flat.     Palpations: Abdomen is soft.  Skin:    General: Skin is warm and dry.     Capillary Refill: Capillary refill takes less than 2 seconds.     Labs reviewed: Recent Labs    12/30/21 1515 12/31/21 0735 03/10/22 0000  NA 135 137 138  K 4.1 3.9 4.4  CL 103 108 105  CO2 23 21* 26*  GLUCOSE 103* 102*  --   BUN 36* 35* 39*  CREATININE 1.80* 1.62* 1.5*  CALCIUM 8.7* 8.5* 8.3*   Recent Labs    10/15/21 0000 12/30/21 1515  AST 19 28  ALT 15 24  ALKPHOS 44 60  BILITOT  --  0.6  PROT  --  6.6  ALBUMIN 3.4* 3.0*   Recent Labs    10/15/21 0000 12/30/21 1515 03/10/22 0000  WBC 6.1 15.4* 9.4  NEUTROABS 3,605.00 11.2*  --   HGB 11.1* 10.4* 7.6*  HCT 34* 33.0* 25*  MCV  --  90.9  --   PLT 252 521* 499*   No results found for: "TSH" Lab Results  Component Value Date   HGBA1C 5.3 12/30/2021   Lab Results  Component Value Date   CHOL 107 12/31/2021   HDL 26 (L) 12/31/2021   LDLCALC 57 12/31/2021  TRIG 122 12/31/2021   CHOLHDL 4.1 12/31/2021    Significant Diagnostic Results in last 30 days:  No results found.  Assessment/Plan 1. Hospice care patient  Patient with significant decline over the weekend. Unable to tolerate oral medications at this time. Discussed concern for status with  family. Prognosis is days to weeks based on current status if he does not improve. Hospice nurse and social worker to help support family and contact family to have time with patient since they all live far away. Went to see his wife and based on her baseline dementia, unclear to what extent she is able to understand his current state. Will continue to monitor, and appreciate Hospice support.    Family/ staff Communication: Daughter, nursing, hospice care nurse   Labs/tests ordered:  none  Tomasa Rand, MD, [place] [phone]

## 2022-04-01 ENCOUNTER — Encounter: Payer: Self-pay | Admitting: Student

## 2022-04-22 NOTE — Telephone Encounter (Signed)
Receieved call from nurse as on-call provider that patient has passed at 2:33PM. Body to be transferred to funeral home.   Of note saw patient and his daughter earlier today and he appeared comfortable. Provided comforting words to daughter and reminisced on memories shared regarding her father.   Tomasa Rand, MD, [facility] [phone]

## 2022-04-22 DEATH — deceased
